# Patient Record
Sex: Female | Born: 1970
Health system: Southern US, Community
[De-identification: ages and names within clinical notes are randomized; demographics above are authoritative.]

## PROBLEM LIST (undated history)

## (undated) DIAGNOSIS — Z789 Other specified health status: Secondary | ICD-10-CM

## (undated) HISTORY — PX: TUBAL LIGATION: SHX77

## (undated) HISTORY — PX: APPENDECTOMY: SHX54

---

## 2017-01-19 ENCOUNTER — Ambulatory Visit (INDEPENDENT_AMBULATORY_CARE_PROVIDER_SITE_OTHER): Payer: 59 | Admitting: Physician Assistant

## 2017-01-19 ENCOUNTER — Encounter (INDEPENDENT_AMBULATORY_CARE_PROVIDER_SITE_OTHER): Payer: Self-pay | Admitting: Physician Assistant

## 2017-01-19 VITALS — BP 113/81 | HR 83 | Temp 98.6°F | Ht 63.5 in | Wt 264.2 lb

## 2017-01-19 DIAGNOSIS — D170 Benign lipomatous neoplasm of skin and subcutaneous tissue of head, face and neck: Secondary | ICD-10-CM | POA: Diagnosis not present

## 2017-01-19 DIAGNOSIS — K5900 Constipation, unspecified: Secondary | ICD-10-CM

## 2017-01-19 DIAGNOSIS — R202 Paresthesia of skin: Secondary | ICD-10-CM

## 2017-01-19 DIAGNOSIS — E669 Obesity, unspecified: Secondary | ICD-10-CM | POA: Diagnosis not present

## 2017-01-19 DIAGNOSIS — R030 Elevated blood-pressure reading, without diagnosis of hypertension: Secondary | ICD-10-CM | POA: Diagnosis not present

## 2017-01-19 MED ORDER — DOCUSATE SODIUM 50 MG PO CAPS: 50.0000 mg | ORAL_CAPSULE | Freq: Two times a day (BID) | ORAL | 0 refills | Status: DC

## 2017-01-19 MED ORDER — GABAPENTIN 300 MG PO CAPS: 300.0000 mg | ORAL_CAPSULE | Freq: Every day | ORAL | 0 refills | Status: DC

## 2017-01-19 NOTE — Progress Notes (Signed)
  Subjective:  Patient ID: Sherry Weaver, female    DOB: 09-25-1971  Age: 46 y.o. MRN: [id number]  CC: constipation, neck lump   HPI Sherry Weaver is a 46 y.o. female with a PMH of constipation presents to establish care. Has been having issues with constipation but has been eating Activia yogurt when constipated. Activia fully resolves the constipation for that specific occurrence but is concerned that there is always underlying constipation if no yogurt is eaten. Requests a stool softener. Also would like to address a large lump on the back of her neck. Was previously told it was a lipomabut never had it removed. She is amenable to having removal now. Lastly, she has occasional paresthesia of the RUE possibly attributed to the neck lump. Does not endorse any other symptoms.  Review of Systems  Constitutional: Negative for chills, fever and malaise/fatigue.  Eyes: Negative for blurred vision.  Respiratory: Negative for shortness of breath.   Cardiovascular: Negative for chest pain and palpitations.  Gastrointestinal: Positive for constipation. Negative for abdominal pain and nausea.  Genitourinary: Negative for dysuria and hematuria.  Musculoskeletal: Negative for joint pain and myalgias.  Skin: Negative for rash.       Lump on neck  Neurological: Positive for tingling. Negative for headaches.  Psychiatric/Behavioral: Negative for depression. The patient is not nervous/anxious.     Objective:  BP 113/81   Pulse 83   Temp 98.6 F (37 C) (Oral)   Ht 5' 3.5" (1.613 m)   Wt 264 lb 3.2 oz (119.8 kg)   LMP 01/07/2017 (Exact Date)   SpO2 97%   BMI 46.07 kg/m   BP/Weight 9/32/3557  Systolic BP 322  Diastolic BP 81  Wt. (Lbs) 264.2  BMI 46.07      Physical Exam  Constitutional: She is oriented to person, place, and time.  Well developed, obese, NAD, polite  HENT:  Head: Normocephalic and atraumatic.  Eyes: Conjunctivae are normal. No scleral icterus.  Neck: Normal  range of motion. Neck supple. No thyromegaly present.  Cardiovascular: Normal rate, regular rhythm and normal heart sounds.   Pulmonary/Chest: Effort normal and breath sounds normal.  Musculoskeletal: She exhibits no edema.  Neurological: She is alert and oriented to person, place, and time. No cranial nerve deficit. Coordination normal.  Skin: Skin is warm and dry. No rash noted. No erythema. No pallor.  Psychiatric: She has a normal mood and affect. Her behavior is normal. Thought content normal.  Vitals reviewed.    Assessment & Plan:   1. Constipation, unspecified constipation type - docusate sodium (COLACE) 50 MG capsule; Take 1 capsule (50 mg total) by mouth 2 (two) times daily.  Dispense: 10 capsule; Refill: 0  2. Paresthesia - gabapentin (NEURONTIN) 300 MG capsule; Take 1 capsule (300 mg total) by mouth at bedtime.  Dispense: 30 capsule; Refill: 0  3. Obesity, unspecified classification, unspecified obesity type, unspecified whether serious comorbidity present - Weight loss treatment will begin after her full physical and if her BP is normal.  4. Elevated blood pressure reading without diagnosis of hypertension     Follow-up: Return in about 1 week (around 01/26/2017) for full physical, BP.   Clent Demark PA

## 2017-01-19 NOTE — Patient Instructions (Signed)
Paresthesia Paresthesia is an abnormal burning or prickling sensation. This sensation is generally felt in the hands, arms, legs, or feet. However, it may occur in any part of the body. Usually, it is not painful. The feeling may be described as:  Tingling or numbness.  Pins and needles.  Skin crawling.  Buzzing.  Limbs falling asleep.  Itching. Most people experience temporary (transient) paresthesia at some time in their lives. Paresthesia may occur when you breathe too quickly (hyperventilation). It can also occur without any apparent cause. Commonly, paresthesia occurs when pressure is placed on a nerve. The sensation quickly goes away after the pressure is removed. For some people, however, paresthesia is a long-lasting (chronic) condition that is caused by an underlying disorder. If you continue to have paresthesia, you may need further medical evaluation. Follow these instructions at home: Watch your condition for any changes. Taking the following actions may help to lessen any discomfort that you are feeling:  Avoid drinking alcohol.  Try acupuncture or massage to help relieve your symptoms.  Keep all follow-up visits as directed by your health care provider. This is important. Contact a health care provider if:  You continue to have episodes of paresthesia.  Your burning or prickling feeling gets worse when you walk.  You have pain, cramps, or dizziness.  You develop a rash. Get help right away if:  You feel weak.  You have trouble walking or moving.  You have problems with speech, understanding, or vision.  You feel confused.  You cannot control your bladder or bowel movements.  You have numbness after an injury.  You faint. This information is not intended to replace advice given to you by your health care provider. Make sure you discuss any questions you have with your health care provider. Document Released: 09/09/2002 Document Revised: 02/25/2016 Document  Reviewed: 09/15/2014 Elsevier Interactive Patient Education  2017 [employer].

## 2017-01-23 ENCOUNTER — Telehealth (INDEPENDENT_AMBULATORY_CARE_PROVIDER_SITE_OTHER): Payer: Self-pay | Admitting: Physician Assistant

## 2017-01-23 NOTE — Telephone Encounter (Signed)
Fwd to PCP. Priscilla Kirstein S Floella Ensz, CMA  

## 2017-01-23 NOTE — Telephone Encounter (Signed)
Patient called stated needs RX for Stool softner and RX for pain in her arm  Please send to [facility] outpatient Pharm.  Please follow up with patient

## 2017-01-24 ENCOUNTER — Other Ambulatory Visit (INDEPENDENT_AMBULATORY_CARE_PROVIDER_SITE_OTHER): Payer: Self-pay | Admitting: Physician Assistant

## 2017-01-24 DIAGNOSIS — R202 Paresthesia of skin: Secondary | ICD-10-CM

## 2017-01-24 DIAGNOSIS — K5900 Constipation, unspecified: Secondary | ICD-10-CM

## 2017-01-24 MED ORDER — DOCUSATE SODIUM 50 MG PO CAPS: 50.0000 mg | ORAL_CAPSULE | Freq: Two times a day (BID) | ORAL | 0 refills | Status: DC

## 2017-01-24 MED ORDER — GABAPENTIN 300 MG PO CAPS: 300.0000 mg | ORAL_CAPSULE | Freq: Every day | ORAL | 0 refills | Status: DC

## 2017-01-24 NOTE — Progress Notes (Signed)
Resent rx

## 2017-01-24 NOTE — Telephone Encounter (Signed)
Notify patient that I resent gabapentin and stool softener.

## 2017-01-25 ENCOUNTER — Encounter (INDEPENDENT_AMBULATORY_CARE_PROVIDER_SITE_OTHER): Payer: Self-pay | Admitting: Physician Assistant

## 2017-01-25 ENCOUNTER — Ambulatory Visit (INDEPENDENT_AMBULATORY_CARE_PROVIDER_SITE_OTHER): Payer: 59 | Admitting: Physician Assistant

## 2017-01-25 ENCOUNTER — Other Ambulatory Visit (HOSPITAL_COMMUNITY)
Admission: RE | Admit: 2017-01-25 | Discharge: 2017-01-25 | Disposition: A | Discharge status: Home / Self Care | Payer: 59 | Source: Ambulatory Visit | Attending: Family Medicine | Admitting: Family Medicine

## 2017-01-25 VITALS — BP 114/82 | HR 76 | Temp 97.9°F | Wt 264.8 lb

## 2017-01-25 DIAGNOSIS — E669 Obesity, unspecified: Secondary | ICD-10-CM

## 2017-01-25 DIAGNOSIS — Z01419 Encounter for gynecological examination (general) (routine) without abnormal findings: Secondary | ICD-10-CM | POA: Insufficient documentation

## 2017-01-25 DIAGNOSIS — Z114 Encounter for screening for human immunodeficiency virus [HIV]: Secondary | ICD-10-CM | POA: Diagnosis not present

## 2017-01-25 DIAGNOSIS — Z124 Encounter for screening for malignant neoplasm of cervix: Secondary | ICD-10-CM | POA: Diagnosis not present

## 2017-01-25 DIAGNOSIS — Z23 Encounter for immunization: Secondary | ICD-10-CM | POA: Diagnosis not present

## 2017-01-25 DIAGNOSIS — Z1231 Encounter for screening mammogram for malignant neoplasm of breast: Secondary | ICD-10-CM | POA: Diagnosis not present

## 2017-01-25 DIAGNOSIS — Z0000 Encounter for general adult medical examination without abnormal findings: Secondary | ICD-10-CM

## 2017-01-25 MED ORDER — PHENTERMINE HCL 15 MG PO CAPS: 15.0000 mg | ORAL_CAPSULE | ORAL | 0 refills | Status: DC

## 2017-01-25 MED FILL — GABAPENTIN 300 MG CAPSULE: 300 | 30 days supply | Qty: 30 | Fill #0

## 2017-01-25 MED FILL — PHENTERMINE 15 MG CAPSULE: 15 | 30 days supply | Qty: 30 | Fill #0

## 2017-01-25 NOTE — Progress Notes (Signed)
Subjective:  Patient ID: Sherry Weaver, female    DOB: [birth year]  Age: 46 y.o. MRN: [id number]  CC: annual exam  HPI Sherry Weaver is a 46 y.o. female with a PMH of obesity presents for annual physical. No current complaints. Would like to start on weight loss medication. She has begun to exercise and watch her diet. Denies any current symptoms.   Outpatient Medications Prior to Visit  Medication Sig Dispense Refill  . docusate sodium (COLACE) 50 MG capsule Take 1 capsule (50 mg total) by mouth 2 (two) times daily. 10 capsule 0  . gabapentin (NEURONTIN) 300 MG capsule Take 1 capsule (300 mg total) by mouth at bedtime. 30 capsule 0   No facility-administered medications prior to visit.      ROS Review of Systems  Constitutional: Negative for chills, fever and malaise/fatigue.  Eyes: Negative for blurred vision.  Respiratory: Negative for shortness of breath.   Cardiovascular: Negative for chest pain, palpitations, orthopnea, leg swelling and PND.  Gastrointestinal: Negative for abdominal pain and nausea.  Genitourinary: Negative for dysuria and hematuria.  Musculoskeletal: Negative for joint pain and myalgias.  Skin: Negative for rash.  Neurological: Negative for tingling and headaches.  Psychiatric/Behavioral: Negative for depression. The patient is not nervous/anxious.     Objective:  BP 114/82 (BP Location: Left Arm, Patient Position: Sitting, Cuff Size: Large)   Pulse 76   Temp 97.9 F (36.6 C) (Oral)   Wt 264 lb 12.8 oz (120.1 kg)   LMP 01/07/2017 (Exact Date)   SpO2 98%   BMI 46.17 kg/m   BP/Weight 01/25/2017 01/24/5360  Systolic BP 443 154  Diastolic BP 82 81  Wt. (Lbs) 264.8 264.2  BMI 46.17 46.07      Physical Exam  Constitutional: She is oriented to person, place, and time.  Well developed, obese, NAD, polite  HENT:  Head: Normocephalic and atraumatic.  Eyes: EOM are normal. Pupils are equal, round, and reactive to light. No scleral icterus.   Neck: Normal range of motion. Neck supple. No thyromegaly present.  Cardiovascular: Normal rate, regular rhythm and normal heart sounds.   Pulmonary/Chest: Effort normal and breath sounds normal.  Abdominal: Soft. Bowel sounds are normal. She exhibits no distension. There is no tenderness.  Genitourinary: Vagina normal and uterus normal. No vaginal discharge found.  Musculoskeletal: She exhibits no edema, tenderness or deformity.  Back, LEs, and UEs with full aROM.   Lymphadenopathy:    She has no cervical adenopathy.  Neurological: She is alert and oriented to person, place, and time. No cranial nerve deficit. Coordination normal.  Strength 5/5 throughout. Patellar DTR 1+ bilaterally  Skin: Skin is warm and dry. No rash noted. No erythema. No pallor.  Psychiatric: She has a normal mood and affect. Her behavior is normal. Thought content normal.  Vitals reviewed.    Assessment & Plan:   1. Annual physical exam - CBC with Differential - Comprehensive metabolic panel - TSH - Lipid panel  2. Screening mammogram, encounter for - MM DIGITAL SCREENING BILATERAL; Future  3. Screening for cervical cancer - Cytology - PAP  4. Screening for HIV (human immunodeficiency virus) - HIV antibody  5. Need for Tdap vaccination - Tdap vaccine greater than or equal to 7yo IM  6. Obesity without serious comorbidity, unspecified classification, unspecified obesity type - phentermine 15 MG capsule; Take 1 capsule (15 mg total) by mouth every morning.  Dispense: 30 capsule; Refill: 0   Meds ordered this encounter  Medications  .  phentermine 15 MG capsule    Sig: Take 1 capsule (15 mg total) by mouth every morning.    Dispense:  30 capsule    Refill:  0    Order Specific Question:   Supervising Provider    Answer:   Tresa Garter [[id number]]    Follow-up: Return in about 4 weeks (around 02/22/2017) for Obesity.   Clent Demark PA

## 2017-01-25 NOTE — Patient Instructions (Addendum)
Phentermine tablets or capsules What is this medicine? PHENTERMINE (FEN ter meen) decreases your appetite. It is used with a reduced calorie diet and exercise to help you lose weight. This medicine may be used for other purposes; ask your health care provider or pharmacist if you have questions. COMMON BRAND NAME(S): Adipex-P, Atti-Plex P, Atti-Plex P Spansule, Fastin, Lomaira, Pro-Fast, Tara-8 What should I tell my health care provider before I take this medicine? They need to know if you have any of these conditions: -agitation -glaucoma -heart disease -high blood pressure -history of substance abuse -lung disease called Primary Pulmonary Hypertension (PPH) -taken an MAOI like Carbex, Eldepryl, Marplan, Nardil, or Parnate in last 14 days -thyroid disease -an unusual or allergic reaction to phentermine, other medicines, foods, dyes, or preservatives -pregnant or trying to get pregnant -breast-feeding How should I use this medicine? Take this medicine by mouth with a glass of water. Follow the directions on the prescription label. The instructions for use may differ based on the product and dose you are taking. Avoid taking this medicine in the evening. It may interfere with sleep. Take your doses at regular intervals. Do not take your medicine more often than directed. Talk to your pediatrician regarding the use of this medicine in children. While this drug may be prescribed for children 17 years or older for selected conditions, precautions do apply. Overdosage: If you think you have taken too much of this medicine contact a poison control center or emergency room at once. NOTE: This medicine is only for you. Do not share this medicine with others. What if I miss a dose? If you miss a dose, take it as soon as you can. If it is almost time for your next dose, take only that dose. Do not take double or extra doses. What may interact with this medicine? Do not take this medicine with any of  the following medications: -duloxetine -MAOIs like Carbex, Eldepryl, Marplan, Nardil, and Parnate -medicines for colds or breathing difficulties like pseudoephedrine or phenylephrine -procarbazine -sibutramine -SSRIs like citalopram, escitalopram, fluoxetine, fluvoxamine, paroxetine, and sertraline -stimulants like dexmethylphenidate, methylphenidate or modafinil -venlafaxine This medicine may also interact with the following medications: -medicines for diabetes This list may not describe all possible interactions. Give your health care provider a list of all the medicines, herbs, non-prescription drugs, or dietary supplements you use. Also tell them if you smoke, drink alcohol, or use illegal drugs. Some items may interact with your medicine. What should I watch for while using this medicine? Notify your physician immediately if you become short of breath while doing your normal activities. Do not take this medicine within 6 hours of bedtime. It can keep you from getting to sleep. Avoid drinks that contain caffeine and try to stick to a regular bedtime every night. This medicine was intended to be used in addition to a healthy diet and exercise. The best results are achieved this way. This medicine is only indicated for short-term use. Eventually your weight loss may level out. At that point, the drug will only help you maintain your new weight. Do not increase or in any way change your dose without consulting your doctor. You may get drowsy or dizzy. Do not drive, use machinery, or do anything that needs mental alertness until you know how this medicine affects you. Do not stand or sit up quickly, especially if you are an older patient. This reduces the risk of dizzy or fainting spells. Alcohol may increase dizziness and drowsiness. Avoid alcoholic   drinks. What side effects may I notice from receiving this medicine? Side effects that you should report to your doctor or health care professional as  soon as possible: -chest pain, palpitations -depression or severe changes in mood -increased blood pressure -irritability -nervousness or restlessness -severe dizziness -shortness of breath -problems urinating -unusual swelling of the legs -vomiting Side effects that usually do not require medical attention (report to your doctor or health care professional if they continue or are bothersome): -blurred vision or other eye problems -changes in sexual ability or desire -constipation or diarrhea -difficulty sleeping -dry mouth or unpleasant taste -headache -nausea This list may not describe all possible side effects. Call your doctor for medical advice about side effects. You may report side effects to FDA at 1-800-FDA-1088. Where should I keep my medicine? Keep out of the reach of children. This medicine can be abused. Keep your medicine in a safe place to protect it from theft. Do not share this medicine with anyone. Selling or giving away this medicine is dangerous and against the law. This medicine may cause accidental overdose and death if taken by other adults, children, or pets. Mix any unused medicine with a substance like cat litter or coffee grounds. Then throw the medicine away in a sealed container like a sealed bag or a coffee can with a lid. Do not use the medicine after the expiration date. Store at room temperature between 20 and 25 degrees C (68 and 77 degrees F). Keep container tightly closed. NOTE: This sheet is a summary. It may not cover all possible information. If you have questions about this medicine, talk to your doctor, pharmacist, or health care provider.  2018 Elsevier/Gold Standard (2015-06-26 12:53:15) Obesity, Adult Obesity is the condition of having too much total body fat. Being overweight or obese means that your weight is greater than what is considered healthy for your body size. Obesity is determined by a measurement called BMI. BMI is an estimate of  body fat and is calculated from height and weight. For adults, a BMI of 30 or higher is considered obese. Obesity can eventually lead to other health concerns and major illnesses, including:  Stroke.  Coronary artery disease (CAD).  Type 2 diabetes.  Some types of cancer, including cancers of the colon, breast, uterus, and gallbladder.  Osteoarthritis.  High blood pressure (hypertension).  High cholesterol.  Sleep apnea.  Gallbladder stones.  Infertility problems. What are the causes? The main cause of obesity is taking in (consuming) more calories than your body uses for energy. Other factors that contribute to this condition may include:  Being born with genes that make you more likely to become obese.  Having a medical condition that causes obesity. These conditions include:  Hypothyroidism.  Polycystic ovarian syndrome (PCOS).  Binge-eating disorder.  Cushing syndrome.  Taking certain medicines, such as steroids, antidepressants, and seizure medicines.  Not being physically active (sedentary lifestyle).  Living where there are limited places to exercise safely or buy healthy foods.  Not getting enough sleep. What increases the risk? The following factors may increase your risk of this condition:  Having a family history of obesity.  Being a woman of African-American descent.  Being a man of Hispanic descent. What are the signs or symptoms? Having excessive body fat is the main symptom of this condition. How is this diagnosed? This condition may be diagnosed based on:  Your symptoms.  Your medical history.  A physical exam. Your health care provider may measure:  Your BMI. If you are an adult with a BMI between 25 and less than 30, you are considered overweight. If you are an adult with a BMI of 30 or higher, you are considered obese.  The distances around your hips and your waist (circumferences). These may be compared to each other to help  diagnose your condition.  Your skinfold thickness. Your health care provider may gently pinch a fold of your skin and measure it. How is this treated? Treatment for this condition often includes changing your lifestyle. Treatment may include some or all of the following:  Dietary changes. Work with your health care provider and a dietitian to set a weight-loss goal that is healthy and reasonable for you. Dietary changes may include eating:  Smaller portions. A portion size is the amount of a particular food that is healthy for you to eat at one time. This varies from person to person.  Low-calorie or low-fat options.  More whole grains, fruits, and vegetables.  Regular physical activity. This may include aerobic activity (cardio) and strength training.  Medicine to help you lose weight. Your health care provider may prescribe medicine if you are unable to lose 1 pound a week after 6 weeks of eating more healthily and doing more physical activity.  Surgery. Surgical options may include gastric banding and gastric bypass. Surgery may be done if:  Other treatments have not helped to improve your condition.  You have a BMI of 40 or higher.  You have life-threatening health problems related to obesity. Follow these instructions at home:   Eating and drinking    Follow recommendations from your health care provider about what you eat and drink. Your health care provider may advise you to:  Limit fast foods, sweets, and processed snack foods.  Choose low-fat options, such as low-fat milk instead of whole milk.  Eat 5 or more servings of fruits or vegetables every day.  Eat at home more often. This gives you more control over what you eat.  Choose healthy foods when you eat out.  Learn what a healthy portion size is.  Keep low-fat snacks on hand.  Avoid sugary drinks, such as soda, fruit juice, iced tea sweetened with sugar, and flavored milk.  Eat a healthy  breakfast.  Drink enough water to keep your urine clear or pale yellow.  Do not go without eating for long periods of time (do not fast) or follow a fad diet. Fasting and fad diets can be unhealthy and even dangerous. Physical Activity   Exercise regularly, as told by your health care provider. Ask your health care provider what types of exercise are safe for you and how often you should exercise.  Warm up and stretch before being active.  Cool down and stretch after being active.  Rest between periods of activity. Lifestyle   Limit the time that you spend in front of your TV, computer, or video game system.  Find ways to reward yourself that do not involve food.  Limit alcohol intake to no more than 1 drink a day for nonpregnant women and 2 drinks a day for men. One drink equals 12 oz of beer, 5 oz of wine, or 1 oz of hard liquor. General instructions   Keep a weight loss journal to keep track of the food you eat and how much you exercise you get.  Take over-the-counter and prescription medicines only as told by your health care provider.  Take vitamins and supplements only as told  by your health care provider.  Consider joining a support group. Your health care provider may be able to recommend a support group.  Keep all follow-up visits as told by your health care provider. This is important. Contact a health care provider if:  You are unable to meet your weight loss goal after 6 weeks of dietary and lifestyle changes. This information is not intended to replace advice given to you by your health care provider. Make sure you discuss any questions you have with your health care provider. Document Released: 10/27/2004 Document Revised: 02/22/2016 Document Reviewed: 07/08/2015 Elsevier Interactive Patient Education  2017 [employer].

## 2017-01-25 NOTE — Telephone Encounter (Signed)
Patient notified. Nat Christen, CMA

## 2017-01-26 ENCOUNTER — Other Ambulatory Visit (INDEPENDENT_AMBULATORY_CARE_PROVIDER_SITE_OTHER): Payer: Self-pay | Admitting: Physician Assistant

## 2017-01-26 DIAGNOSIS — Z1231 Encounter for screening mammogram for malignant neoplasm of breast: Secondary | ICD-10-CM

## 2017-01-26 LAB — LIPID PANEL
Chol/HDL Ratio: 5 ratio — ABNORMAL HIGH (ref 0.0–4.4)
Cholesterol, Total: 201 mg/dL — ABNORMAL HIGH (ref 100–199)
HDL: 40 mg/dL (ref >39)
LDL Calculated: 140 mg/dL — ABNORMAL HIGH (ref 0–99)
Triglycerides: 105 mg/dL (ref 0–149)
VLDL Cholesterol Cal: 21 mg/dL (ref 5–40)

## 2017-01-26 LAB — CBC WITH DIFFERENTIAL/PLATELET
BASOS ABS: 0 10*3/uL (ref 0.0–0.2)
Basos: 0 %
EOS (ABSOLUTE): 0.1 10*3/uL (ref 0.0–0.4)
Eos: 1 %
Hematocrit: 35.4 % (ref 34.0–46.6)
Hemoglobin: 11.4 g/dL (ref 11.1–15.9)
Immature Grans (Abs): 0 10*3/uL (ref 0.0–0.1)
Immature Granulocytes: 0 %
LYMPHS ABS: 5 10*3/uL — AB (ref 0.7–3.1)
Lymphs: 59 %
MCH: 26.8 pg (ref 26.6–33.0)
MCHC: 32.2 g/dL (ref 31.5–35.7)
MCV: 83 fL (ref 79–97)
MONOS ABS: 0.5 10*3/uL (ref 0.1–0.9)
Monocytes: 6 %
NEUTROS ABS: 2.9 10*3/uL (ref 1.4–7.0)
Neutrophils: 34 %
PLATELETS: 314 10*3/uL (ref 150–379)
RBC: 4.25 x10E6/uL (ref 3.77–5.28)
RDW: 15.2 % (ref 12.3–15.4)
WBC: 8.6 10*3/uL (ref 3.4–10.8)

## 2017-01-26 LAB — COMPREHENSIVE METABOLIC PANEL
ALBUMIN: 3.8 g/dL (ref 3.5–5.5)
ALK PHOS: 51 IU/L (ref 39–117)
ALT: 15 IU/L (ref 0–32)
AST: 15 IU/L (ref 0–40)
Albumin/Globulin Ratio: 1 — ABNORMAL LOW (ref 1.2–2.2)
BUN / CREAT RATIO: 15 (ref 9–23)
BUN: 11 mg/dL (ref 6–24)
CHLORIDE: 104 mmol/L (ref 96–106)
CO2: 20 mmol/L (ref 18–29)
Calcium: 8.8 mg/dL (ref 8.7–10.2)
Creatinine, Ser: 0.75 mg/dL (ref 0.57–1.00)
GFR calc Af Amer: 111 mL/min/{1.73_m2} (ref >59)
GFR calc non Af Amer: 97 mL/min/{1.73_m2} (ref >59)
GLUCOSE: 94 mg/dL (ref 65–99)
Globulin, Total: 3.8 g/dL (ref 1.5–4.5)
Potassium: 3.9 mmol/L (ref 3.5–5.2)
Sodium: 139 mmol/L (ref 134–144)
Total Protein: 7.6 g/dL (ref 6.0–8.5)

## 2017-01-26 LAB — TSH: TSH: 3.52 u[IU]/mL (ref 0.450–4.500)

## 2017-01-26 LAB — CYTOLOGY - PAP: Diagnosis: NEGATIVE

## 2017-01-26 LAB — HIV ANTIBODY (ROUTINE TESTING W REFLEX): HIV SCREEN 4TH GENERATION: NONREACTIVE

## 2017-02-20 ENCOUNTER — Ambulatory Visit: Payer: 59

## 2017-02-25 ENCOUNTER — Emergency Department (HOSPITAL_COMMUNITY)
Admission: EM | Admit: 2017-02-25 | Discharge: 2017-02-25 | Disposition: A | Discharge status: Home / Self Care | Payer: 59 | Attending: Emergency Medicine | Admitting: Emergency Medicine

## 2017-02-25 ENCOUNTER — Encounter (HOSPITAL_COMMUNITY): Payer: Self-pay

## 2017-02-25 DIAGNOSIS — T781XXA Other adverse food reactions, not elsewhere classified, initial encounter: Secondary | ICD-10-CM | POA: Diagnosis not present

## 2017-02-25 DIAGNOSIS — T7840XA Allergy, unspecified, initial encounter: Secondary | ICD-10-CM

## 2017-02-25 DIAGNOSIS — Z91013 Allergy to seafood: Secondary | ICD-10-CM | POA: Diagnosis not present

## 2017-02-25 MED ORDER — METHYLPREDNISOLONE SODIUM SUCC 125 MG IJ SOLR
125.0000 mg | Freq: Once | INTRAMUSCULAR | Status: AC
  Administered 2017-02-25: 125 mg via INTRAVENOUS
  Filled 2017-02-25: qty 2

## 2017-02-25 MED ORDER — PREDNISONE 20 MG PO TABS: 40.0000 mg | ORAL_TABLET | Freq: Every day | ORAL | 0 refills | Status: DC

## 2017-02-25 MED ORDER — EPINEPHRINE 0.3 MG/0.3ML IJ SOAJ
0.3000 mg | Freq: Once | INTRAMUSCULAR | Status: AC
  Administered 2017-02-25: 0.3 mg via INTRAMUSCULAR
  Filled 2017-02-25: qty 0.3

## 2017-02-25 MED ORDER — DIPHENHYDRAMINE HCL 50 MG/ML IJ SOLN
50.0000 mg | Freq: Once | INTRAMUSCULAR | Status: AC
  Administered 2017-02-25: 50 mg via INTRAVENOUS
  Filled 2017-02-25: qty 1

## 2017-02-25 MED ORDER — EPINEPHRINE 0.3 MG/0.3ML IJ SOAJ: 0.3000 mg | Freq: Once | INTRAMUSCULAR | 1 refills | Status: AC | PRN

## 2017-02-25 MED ORDER — FAMOTIDINE IN NACL 20-0.9 MG/50ML-% IV SOLN
20.0000 mg | Freq: Once | INTRAVENOUS | Status: AC
  Administered 2017-02-25: 20 mg via INTRAVENOUS
  Filled 2017-02-25: qty 50

## 2017-02-25 NOTE — Discharge Instructions (Signed)
You need to follow-up with an allergist.  See the information provided.  If you experience these symptoms again give yourself the epipen and call 911.

## 2017-02-25 NOTE — ED Triage Notes (Signed)
Pt complaining of allergic reaction. Pt states ate seafood tonight. Began itching in bilateral hand and throat x 1 hour. Pt with clear breathsounds, airway intact. Sats = 98% on RA.

## 2017-02-25 NOTE — ED Provider Notes (Signed)
[facility] DEPT Provider Note   CSN: [id number] Arrival date & time: 02/25/17  0057     History   Chief Complaint Chief Complaint  Patient presents with  . Allergic Reaction    HPI Sherry Weaver is a 46 y.o. female.  Patient presents emergency department with chief complaint of allergic reaction. She states that she ate shrimp tonight. She has never had an allergic reaction to shrimp before, but states that shortly thereafter she began itching, and began having swelling of bilateral hands and feet and also began having some tightness sensation in her throat with tingling around her lips. She states that she feels slightly short of breath. She denies any wheezing. She has not taken anything for her symptoms. There are no other associated symptoms or modifying factors. She has never had an anaphylactic reaction before.   The history is provided by the patient. No language interpreter was used.    History reviewed. No pertinent past medical history.  Patient Active Problem List   Diagnosis Date Noted  . 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 Obesity 01/19/2017  . Constipation 01/19/2017    History reviewed. No pertinent surgical history.  OB History    No data available       Home Medications    Prior to Admission medications   Medication Sig Start Date End Date Taking? Authorizing Provider  docusate sodium (COLACE) 50 MG capsule Take 1 capsule (50 mg total) by mouth 2 (two) times daily. 01/24/17   Clent Demark, PA-C  gabapentin (NEURONTIN) 300 MG capsule Take 1 capsule (300 mg total) by mouth at bedtime. 01/24/17   Clent Demark, PA-C  phentermine 15 MG capsule Take 1 capsule (15 mg total) by mouth every morning. 01/25/17   Clent Demark, PA-C    Family History History reviewed. No pertinent family history.  Social History Social History  Substance Use Topics  . Smoking status: Never Smoker  . Smokeless tobacco: Never Used  . Alcohol use No      Allergies   Penicillins   Review of Systems Review of Systems  All other systems reviewed and are negative.    Physical Exam Updated Vital Signs BP (!) 146/87 (BP Location: Left Arm)   Pulse 88   Temp 99 F (37.2 C) (Oral)   Resp 20   LMP 02/24/2017 (Exact Date)   SpO2 97%   Physical Exam  Constitutional: She is oriented to person, place, and time. She appears well-developed and well-nourished.  HENT:  Head: Normocephalic and atraumatic.  Mildly raspy voice, airway is intact, no evidence of oropharyngeal swelling on exam  Eyes: Conjunctivae and EOM are normal. Pupils are equal, round, and reactive to light.  Neck: Normal range of motion. Neck supple.  Cardiovascular: Normal rate and regular rhythm.  Exam reveals no gallop and no friction rub.   No murmur heard. Pulmonary/Chest: Effort normal and breath sounds normal. No respiratory distress. She has no wheezes. She has no rales. She exhibits no tenderness.  Clear to auscultation bilaterally  Abdominal: Soft. Bowel sounds are normal. She exhibits no distension and no mass. There is no tenderness. There is no rebound and no guarding.  Musculoskeletal: Normal range of motion. She exhibits no edema or tenderness.  Neurological: She is alert and oriented to person, place, and time.  Skin: Skin is warm and dry.  No active hives  Psychiatric: She has a normal mood and affect. Her behavior is normal. Judgment and thought content normal.  Nursing  note and vitals reviewed.    ED Treatments / Results  Labs (all labs ordered are listed, but only abnormal results are displayed) Labs Reviewed - No data to display  EKG  EKG Interpretation None       Radiology No results found.  Procedures Procedures (including critical care time)  Medications Ordered in ED Medications  famotidine (PEPCID) IVPB 20 mg premix (20 mg Intravenous New Bag/Given 02/25/17 0141)  EPINEPHrine (EPI-PEN) injection 0.3 mg (0.3 mg  Intramuscular Given 02/25/17 0143)  diphenhydrAMINE (BENADRYL) injection 50 mg (50 mg Intravenous Given 02/25/17 0139)  methylPREDNISolone sodium succinate (SOLU-MEDROL) 125 mg/2 mL injection 125 mg (125 mg Intravenous Given 02/25/17 0139)     Initial Impression / Assessment and Plan / ED Course  I have reviewed the triage vital signs and the nursing notes.  Pertinent labs & imaging results that were available during my care of the patient were reviewed by me and considered in my medical decision making (see chart for details).     Patient with throat tightening sensation, lip tingling sensation and some shortness of breath shortly after eating shrimp. She is not in extremis, however given the patient's symptoms, will treat with EpiPen and will give Solu-Medrol, Benadryl, and Pepcid. Plan to monitor patient for at least 4 hours.  2:01 AM Patient reassessed, she states that the tingling sensation and throat tightness sensation have stopped. She is in no apparent distress.  5:27 AM Patient reassessed. She no longer has any raspiness to her voice. She has no rash. Her airway is clear. She has no wheezing on exam. She states that she feels normal now. Will discharge to home with EpiPen prescription and prednisone. Recommend that she follow-up with an allergist. Patient understands and agrees with the plan. She is stable and ready for discharge.  Final Clinical Impressions(s) / ED Diagnoses   Final diagnoses:  Allergic reaction, initial encounter    New Prescriptions New Prescriptions   EPINEPHRINE (EPIPEN 2-PAK) 0.3 MG/0.3 ML IJ SOAJ INJECTION    Inject 0.3 mLs (0.3 mg total) into the muscle once as needed (for severe allergic reaction). CAll 911 immediately if you have to use this medicine   PREDNISONE (DELTASONE) 20 MG TABLET    Take 2 tablets (40 mg total) by mouth daily.     Montine Circle, PA-C 02/25/17 Brewster, April, MD 02/25/17 [phone]

## 2017-02-28 MED FILL — predniSONE 20 MG TABS: 20 | 5 days supply | Qty: 10 | Fill #0

## 2017-02-28 MED FILL — EPINEPHRINE 0.3 MG AUTO-INJ: 0.3 | 2 days supply | Qty: 2 | Fill #0

## 2017-04-20 ENCOUNTER — Ambulatory Visit (INDEPENDENT_AMBULATORY_CARE_PROVIDER_SITE_OTHER): Payer: 59 | Admitting: Physician Assistant

## 2017-05-03 ENCOUNTER — Encounter (INDEPENDENT_AMBULATORY_CARE_PROVIDER_SITE_OTHER): Payer: Self-pay | Admitting: Physician Assistant

## 2017-05-03 ENCOUNTER — Ambulatory Visit (INDEPENDENT_AMBULATORY_CARE_PROVIDER_SITE_OTHER): Payer: 59 | Admitting: Physician Assistant

## 2017-05-03 VITALS — BP 114/79 | HR 80 | Temp 98.1°F | Wt 263.2 lb

## 2017-05-03 DIAGNOSIS — D179 Benign lipomatous neoplasm, unspecified: Secondary | ICD-10-CM | POA: Diagnosis not present

## 2017-05-03 DIAGNOSIS — Z23 Encounter for immunization: Secondary | ICD-10-CM | POA: Diagnosis not present

## 2017-05-03 DIAGNOSIS — R202 Paresthesia of skin: Secondary | ICD-10-CM

## 2017-05-03 DIAGNOSIS — E669 Obesity, unspecified: Secondary | ICD-10-CM

## 2017-05-03 NOTE — Patient Instructions (Signed)

## 2017-05-03 NOTE — Progress Notes (Signed)
Subjective:  Patient ID: Sherry Weaver, female    DOB: 11/02/1970  Age: 46 y.o. MRN: [id number]  CC: f/u obesity  HPI Sherry Weaver is a 46 y.o. female with obesity presents to f/u on obesity. Was taking phentermine 15 mg but eventually had to stop due to tachycardia and hot flashes. She was exercising at one point. Has lost one pound over approximately 3 months.    Patient also complains of paresthesia of the LUE and RUE. Tried gabapentin with no relief of paresthesia. Has surgical consult tomorrow to have lipoma removed. It is thought the lipoma on the back of the neck is having a mass effect to the c spine nerve roots.    Outpatient Medications Prior to Visit  Medication Sig Dispense Refill  . docusate sodium (COLACE) 50 MG capsule Take 1 capsule (50 mg total) by mouth 2 (two) times daily. 10 capsule 0  . EPINEPHrine (EPIPEN 2-PAK) 0.3 mg/0.3 mL IJ SOAJ injection Inject 0.3 mLs (0.3 mg total) into the muscle once as needed (for severe allergic reaction). CAll 911 immediately if you have to use this medicine 1 Device 1  . gabapentin (NEURONTIN) 300 MG capsule Take 1 capsule (300 mg total) by mouth at bedtime. 30 capsule 0  . phentermine 15 MG capsule Take 1 capsule (15 mg total) by mouth every morning. 30 capsule 0  . predniSONE (DELTASONE) 20 MG tablet Take 2 tablets (40 mg total) by mouth daily. 10 tablet 0   No facility-administered medications prior to visit.      ROS Review of Systems  Constitutional: Negative for chills, fever and malaise/fatigue.  Eyes: Negative for blurred vision.  Respiratory: Negative for shortness of breath.   Cardiovascular: Negative for chest pain and palpitations.  Gastrointestinal: Negative for abdominal pain and nausea.  Genitourinary: Negative for dysuria and hematuria.  Musculoskeletal: Negative for joint pain and myalgias.  Skin: Negative for rash.  Neurological: Positive for tingling. Negative for headaches.  Psychiatric/Behavioral:  Negative for depression. The patient is not nervous/anxious.     Objective:  BP 114/79 (BP Location: Left Arm, Patient Position: Sitting, Cuff Size: Large)   Pulse 80   Temp 98.1 F (36.7 C) (Oral)   Wt 263 lb 3.2 oz (119.4 kg)   SpO2 96%   BMI 45.89 kg/m   BP/Weight 05/03/2017 02/25/2017 9/98/3382  Systolic BP 505 397 673  Diastolic BP 79 79 82  Wt. (Lbs) 263.2 - 264.8  BMI 45.89 - 46.17      Physical Exam  Constitutional: She is oriented to person, place, and time.  Well developed, well nourished, NAD, polite  HENT:  Head: Normocephalic and atraumatic.  Eyes: No scleral icterus.  Neck: Normal range of motion. Neck supple. No thyromegaly present.  Cardiovascular: Normal rate, regular rhythm and normal heart sounds.   Pulmonary/Chest: Effort normal and breath sounds normal.  Abdominal: Soft. Bowel sounds are normal. There is no tenderness.  Musculoskeletal: She exhibits no edema.  Neurological: She is alert and oriented to person, place, and time. No cranial nerve deficit.  Light touch sensation of the UE intact bilaterally. Strength 5/5 of UE bilaterally.  Skin: Skin is warm and dry. No rash noted. No erythema. No pallor.  Psychiatric: She has a normal mood and affect. Her behavior is normal. Thought content normal.  Vitals reviewed.    Assessment & Plan:   1. Obesity, unspecified classification, unspecified obesity type, unspecified whether serious comorbidity present - Stop phentermine.  - Will advise on different pharmacological  options after her lipoma excision.  2. Lipoma, unspecified site - Will have surgical consult tomorrow for excision of large nuchal lipoma.  3. Paresthesia - upper extremities likely affected by the lipoma's mass effect on back of neck.  4. Need for Tdap vaccination - Tdap vaccine greater than or equal to 7yo IM     Follow-up: Return in about 4 weeks (around 05/31/2017) for paresthesia.   Clent Demark PA

## 2017-05-04 ENCOUNTER — Other Ambulatory Visit: Payer: Self-pay | Admitting: General Surgery

## 2017-05-04 DIAGNOSIS — Z9851 Tubal ligation status: Secondary | ICD-10-CM | POA: Diagnosis not present

## 2017-05-04 DIAGNOSIS — Z6841 Body Mass Index (BMI) 40.0 and over, adult: Secondary | ICD-10-CM | POA: Diagnosis not present

## 2017-05-04 DIAGNOSIS — Z9049 Acquired absence of other specified parts of digestive tract: Secondary | ICD-10-CM | POA: Diagnosis not present

## 2017-05-04 DIAGNOSIS — R221 Localized swelling, mass and lump, neck: Secondary | ICD-10-CM | POA: Diagnosis not present

## 2017-05-04 DIAGNOSIS — R202 Paresthesia of skin: Secondary | ICD-10-CM | POA: Diagnosis not present

## 2017-05-05 NOTE — H&P (Signed)
Sherry Weaver Location: [facility] Patient #: [id number] DOB: Jan 12, 1971 Married / Language: Undefined / Race: Black or African American Female        History of Present Illness     . 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 She is referred by Clent Demark, PA at [facility], for evaluation management of a huge lipoma of the posterior neck.      She states that she has had a palpable mass on her lower posterior neck for over 4 years. This has been enlarging. She also reports numbness and tingling in both arms from shoulder to hand, intermittent. No pain. The mass on the neck has never been tender or inflamed.       Comorbidities include BMI 47, paresthesias of both arms, borderline hypertension, constipation, history of open appendectomy, history laparoscopic tubal ligation      Social history reveals that she is married with 6 children. They're mostly grown. 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 Denies tobacco or alcohol.       She states she would like the mass removed from her neck regardless because its enlarging and very prominent and visible. That is reasonable. I told her that the numbness and tingling s very unlikely to be caused by the lipoma, and I suggested that it would need to be evaluated by a neurologist or a neurosurgeon. She understands that well I discussed the indications, details, techniques, and numerous risk of the surgery with her. She is aware of the risk of bleeding, infection, drain placement, skin necrosis, seroma formation, nerve damage with chronic pain or numbness. She understands these issues well. All her questions were answered. She agrees with this plan.      She will need to have this surgery done within a [facility] facility due to her employment and insurance issues.   Past Surgical History Appendectomy   Diagnostic Studies History  Colonoscopy   never Mammogram  never Pap Smear  1-5 years ago  Allergies  Penicillin V *PENICILLINS*  SHRIMP  Allergies Reconciled   Medication History  No Current Medications Medications Reconciled  Social History Caffeine use  Coffee, Tea. No alcohol use  No drug use  Tobacco use  Never smoker.  Family History Diabetes Mellitus  Family Members In General. Heart Disease  Family Members In General.  Pregnancy / Birth History  Age at menarche  9 years. Gravida  7 Length (months) of breastfeeding  3-6 Maternal age  20-20 Para  7 Regular periods   Other Problems  No pertinent past medical history     Review of Systems General Not Present- Appetite Loss, Chills, Fatigue, Fever, Night Sweats, Weight Gain and Weight Loss. Skin Not Present- Change in Wart/Mole, Dryness, Hives, Jaundice, New Lesions, Non-Healing Wounds, Rash and Ulcer. HEENT Not Present- Earache, Hearing Loss, Hoarseness, Nose Bleed, Oral Ulcers, Ringing in the Ears, Seasonal Allergies, Sinus Pain, Sore Throat, Visual Disturbances, Wears glasses/contact lenses and Yellow Eyes. Respiratory Not Present- Bloody sputum, Chronic Cough, Difficulty Breathing, Snoring and Wheezing. Breast Not Present- Breast Mass, Breast Pain, Nipple Discharge and Skin Changes. Cardiovascular Not Present- Chest Pain, Difficulty Breathing Lying Down, Leg Cramps, Palpitations, Rapid Heart Rate, Shortness of Breath and Swelling of Extremities. Gastrointestinal Not Present- Abdominal Pain, Bloating, Bloody Stool, Change in Bowel Habits, Chronic diarrhea, Constipation, Difficulty Swallowing, Excessive gas, Gets full quickly at meals, Hemorrhoids, Indigestion, Nausea, Rectal Pain and Vomiting. Female  Genitourinary Not Present- Frequency, Nocturia, Painful Urination, Pelvic Pain and Urgency. Musculoskeletal Not Present- Back Pain, Joint Pain, Joint Stiffness, Muscle Pain, Muscle Weakness and Swelling of Extremities. Neurological  Present- Numbness. Not Present- Decreased Memory, Fainting, Headaches, Seizures, Tingling, Tremor, Trouble walking and Weakness. Psychiatric Not Present- Anxiety, Bipolar, Change in Sleep Pattern, Depression, Fearful and Frequent crying. Endocrine Not Present- Cold Intolerance, Excessive Hunger, Hair Changes, Heat Intolerance, Hot flashes and New Diabetes. Hematology Not Present- Blood Thinners, Easy Bruising, Excessive bleeding, Gland problems, HIV and Persistent Infections.  Vitals  Weight: 266.8 lb Height: 63in Body Surface Area: 2.19 m Body Mass Index: 47.26 kg/m  Temp.: 98.66F  Pulse: 111 (Regular)  P.OX: 99% (Room air) BP: 138/84 (Sitting, Left Arm, Standard)    History and Physical General Mental Status-Alert. General Appearance-Consistent with stated age. Hydration-Well hydrated. Voice-Normal. Note: BMI 47.   Head and Neck Head-normocephalic, atraumatic with no lesions or palpable masses. Trachea-midline. Thyroid Gland Characteristics - normal size and consistency. Note: 12-15 cm soft tissue mass in the midline of the lower posterior neck. This is overlying the third cervical vertebra down to about the second thoracic vertebra. Healthy skin. Very soft and feels like a lipoma. Nontender. No adenopathy   Eye Eyeball - Bilateral-Extraocular movements intact. Sclera/Conjunctiva - Bilateral-No scleral icterus.  Chest and Lung Exam Chest and lung exam reveals -quiet, even and easy respiratory effort with no use of accessory muscles and on auscultation, normal breath sounds, no adventitious sounds and normal vocal resonance. Inspection Chest Wall - Normal. Back - normal.  Cardiovascular Cardiovascular examination reveals -normal heart sounds, regular rate and rhythm with no murmurs and normal pedal pulses bilaterally.  Abdomen Inspection Inspection of the abdomen reveals - No Hernias. Skin - Scar - Note: Right lower quadrant scar.  Umbilical scar. Healed. Palpation/Percussion Palpation and Percussion of the abdomen reveal - Soft, Non Tender, No Rebound tenderness, No Rigidity (guarding) and No hepatosplenomegaly. Auscultation Auscultation of the abdomen reveals - Bowel sounds normal.  Neurologic Neurologic evaluation reveals -alert and oriented x 3 with no impairment of recent or remote memory. Mental Status-Normal.  Musculoskeletal Normal Exam - Left-Upper Extremity Strength Normal and Lower Extremity Strength Normal. Normal Exam - Right-Upper Extremity Strength Normal and Lower Extremity Strength Normal.  Lymphatic Head & Neck  General Head & Neck Lymphatics: Bilateral - Description - Normal. Axillary  General Axillary Region: Bilateral - Description - Normal. Tenderness - Non Tender. Femoral & Inguinal  Generalized Femoral & Inguinal Lymphatics: Bilateral - Description - Normal. Tenderness - Non Tender.    Assessment & Plan  NECK MASS (R22.1) Impression: Posterior neck. 10-12 cm. Suspect lipoma.   The soft tissue mass of your lower posterior neck is almost certainly a lipoma It is almost 15 cm in diameter  This mass is not causing the numbness and tingling in your arms. I suggest she see a neurosurgeon or a neurologist to find a better explanation for this. This may be related to cervical spine disease  Because of the enlargement of the posterior neck mass, you have requested that this be excised you'll be scheduled for excision of the soft tissue mass of your posterior neck under general anesthesia in the near future You should be able to go home the same day You may or may not have a drain postop We have discussed the indications, techniques, and risks of the surgery in detail  BMI 45.0-49.9, ADULT (Z68.42) PARESTHESIA OF ARM (R20.2) Impression: Both arms from shoulder down to and HISTORY OF APPENDECTOMY (  Z90.49) Impression: Open appendectomy. RLQ incision HISTORY OF BILATERAL  TUBAL LIGATION (Z98.51)    Edsel Petrin. Dalbert Batman, M.D., [facility] General and Minimally invasive Surgery Breast and Colorectal Surgery Office:   [phone] Pager:   [phone]

## 2017-05-08 ENCOUNTER — Encounter (HOSPITAL_COMMUNITY): Payer: Self-pay | Admitting: *Deleted

## 2017-05-08 NOTE — Progress Notes (Signed)
Spoke with pt for pre-op call. Pt denies cardiac history, chest pain or sob. Pt states she is not diabetic.  

## 2017-05-09 ENCOUNTER — Ambulatory Visit (HOSPITAL_COMMUNITY)
Admission: RE | Admit: 2017-05-09 | Discharge: 2017-05-09 | Disposition: A | Discharge status: Home / Self Care | Payer: 59 | Source: Ambulatory Visit | Attending: General Surgery | Admitting: General Surgery

## 2017-05-09 ENCOUNTER — Encounter (HOSPITAL_COMMUNITY)
Admission: RE | Disposition: A | Discharge status: Home / Self Care | Payer: Self-pay | Source: Ambulatory Visit | Attending: General Surgery

## 2017-05-09 ENCOUNTER — Encounter (HOSPITAL_COMMUNITY): Payer: Self-pay | Admitting: Urology

## 2017-05-09 ENCOUNTER — Ambulatory Visit (HOSPITAL_COMMUNITY): Payer: 59 | Admitting: Anesthesiology

## 2017-05-09 DIAGNOSIS — K5900 Constipation, unspecified: Secondary | ICD-10-CM | POA: Insufficient documentation

## 2017-05-09 DIAGNOSIS — Z9049 Acquired absence of other specified parts of digestive tract: Secondary | ICD-10-CM | POA: Insufficient documentation

## 2017-05-09 DIAGNOSIS — Z9851 Tubal ligation status: Secondary | ICD-10-CM | POA: Diagnosis not present

## 2017-05-09 DIAGNOSIS — Z880 Allergy status to penicillin: Secondary | ICD-10-CM | POA: Insufficient documentation

## 2017-05-09 DIAGNOSIS — D170 Benign lipomatous neoplasm of skin and subcutaneous tissue of head, face and neck: Secondary | ICD-10-CM | POA: Diagnosis not present

## 2017-05-09 DIAGNOSIS — Z91018 Allergy to other foods: Secondary | ICD-10-CM | POA: Diagnosis not present

## 2017-05-09 DIAGNOSIS — D1739 Benign lipomatous neoplasm of skin and subcutaneous tissue of other sites: Secondary | ICD-10-CM | POA: Diagnosis not present

## 2017-05-09 HISTORY — DX: Other specified health status: Z78.9

## 2017-05-09 HISTORY — PX: EXCISION MASS NECK: SHX6703

## 2017-05-09 LAB — CBC WITH DIFFERENTIAL/PLATELET
BASOS PCT: 1 %
Basophils Absolute: 0 10*3/uL (ref 0.0–0.1)
Eosinophils Absolute: 0.1 10*3/uL (ref 0.0–0.7)
Eosinophils Relative: 2 %
HEMATOCRIT: 36.8 % (ref 36.0–46.0)
HEMOGLOBIN: 11.8 g/dL — AB (ref 12.0–15.0)
LYMPHS ABS: 3.4 10*3/uL (ref 0.7–4.0)
LYMPHS PCT: 52 %
MCH: 26 pg (ref 26.0–34.0)
MCHC: 32.1 g/dL (ref 30.0–36.0)
MCV: 81.2 fL (ref 78.0–100.0)
MONOS PCT: 7 %
Monocytes Absolute: 0.5 10*3/uL (ref 0.1–1.0)
NEUTROS ABS: 2.4 10*3/uL (ref 1.7–7.7)
NEUTROS PCT: 38 %
Platelets: 300 10*3/uL (ref 150–400)
RBC: 4.53 MIL/uL (ref 3.87–5.11)
RDW: 15.2 % (ref 11.5–15.5)
WBC: 6.5 10*3/uL (ref 4.0–10.5)

## 2017-05-09 LAB — COMPREHENSIVE METABOLIC PANEL
ALBUMIN: 3.2 g/dL — AB (ref 3.5–5.0)
ALK PHOS: 39 U/L (ref 38–126)
ALT: 14 U/L (ref 14–54)
ANION GAP: 6 (ref 5–15)
AST: 21 U/L (ref 15–41)
BILIRUBIN TOTAL: 0.6 mg/dL (ref 0.3–1.2)
BUN: 8 mg/dL (ref 6–20)
CALCIUM: 8.6 mg/dL — AB (ref 8.9–10.3)
CO2: 21 mmol/L — ABNORMAL LOW (ref 22–32)
Chloride: 109 mmol/L (ref 101–111)
Creatinine, Ser: 0.75 mg/dL (ref 0.44–1.00)
GFR calc Af Amer: >60 mL/min (ref >60)
GLUCOSE: 100 mg/dL — AB (ref 65–99)
Potassium: 3.9 mmol/L (ref 3.5–5.1)
Sodium: 136 mmol/L (ref 135–145)
TOTAL PROTEIN: 7.2 g/dL (ref 6.5–8.1)

## 2017-05-09 LAB — HCG, SERUM, QUALITATIVE: PREG SERUM: NEGATIVE

## 2017-05-09 SURGERY — EXCISION, MASS, NECK
Anesthesia: General | Site: Neck

## 2017-05-09 MED ORDER — MIDAZOLAM HCL 2 MG/2ML IJ SOLN
INTRAMUSCULAR | Status: DC | PRN
  Administered 2017-05-09: 2 mg via INTRAVENOUS

## 2017-05-09 MED ORDER — LIDOCAINE 2% (20 MG/ML) 5 ML SYRINGE
INTRAMUSCULAR | Status: DC | PRN
  Administered 2017-05-09: 100 mg via INTRAVENOUS

## 2017-05-09 MED ORDER — ACETAMINOPHEN 500 MG PO TABS
1000.0000 mg | ORAL_TABLET | ORAL | Status: AC
  Administered 2017-05-09: 1000 mg via ORAL
  Filled 2017-05-09: qty 2

## 2017-05-09 MED ORDER — DEXAMETHASONE SODIUM PHOSPHATE 10 MG/ML IJ SOLN
INTRAMUSCULAR | Status: AC
  Filled 2017-05-09: qty 1

## 2017-05-09 MED ORDER — FENTANYL CITRATE (PF) 250 MCG/5ML IJ SOLN
INTRAMUSCULAR | Status: AC
Start: 2017-05-09 — End: 2017-05-09
  Filled 2017-05-09: qty 5

## 2017-05-09 MED ORDER — SUGAMMADEX SODIUM 200 MG/2ML IV SOLN
INTRAVENOUS | Status: DC | PRN
  Administered 2017-05-09: 400 mg via INTRAVENOUS

## 2017-05-09 MED ORDER — FENTANYL CITRATE (PF) 250 MCG/5ML IJ SOLN
INTRAMUSCULAR | Status: DC | PRN
  Administered 2017-05-09: 50 ug via INTRAVENOUS
  Administered 2017-05-09: 100 ug via INTRAVENOUS

## 2017-05-09 MED ORDER — ROCURONIUM BROMIDE 10 MG/ML (PF) SYRINGE
PREFILLED_SYRINGE | INTRAVENOUS | Status: AC
Start: 2017-05-09 — End: 2017-05-09
  Filled 2017-05-09: qty 5

## 2017-05-09 MED ORDER — ONDANSETRON HCL 4 MG/2ML IJ SOLN
INTRAMUSCULAR | Status: DC | PRN
  Administered 2017-05-09: 4 mg via INTRAVENOUS

## 2017-05-09 MED ORDER — EPHEDRINE SULFATE-NACL 50-0.9 MG/10ML-% IV SOSY
PREFILLED_SYRINGE | INTRAVENOUS | Status: DC | PRN
  Administered 2017-05-09: 10 mg via INTRAVENOUS

## 2017-05-09 MED ORDER — CHLORHEXIDINE GLUCONATE CLOTH 2 % EX PADS: 6.0000 | MEDICATED_PAD | Freq: Once | CUTANEOUS | Status: DC

## 2017-05-09 MED ORDER — PROPOFOL 10 MG/ML IV BOLUS
INTRAVENOUS | Status: DC | PRN
  Administered 2017-05-09: 200 mg via INTRAVENOUS

## 2017-05-09 MED ORDER — SUCCINYLCHOLINE CHLORIDE 200 MG/10ML IV SOSY
PREFILLED_SYRINGE | INTRAVENOUS | Status: AC
  Filled 2017-05-09: qty 10

## 2017-05-09 MED ORDER — LACTATED RINGERS IV SOLN
INTRAVENOUS | Status: DC
  Administered 2017-05-09 (×2): via INTRAVENOUS

## 2017-05-09 MED ORDER — ONDANSETRON HCL 4 MG/2ML IJ SOLN: 4.0000 mg | Freq: Once | INTRAMUSCULAR | Status: DC | PRN

## 2017-05-09 MED ORDER — MEPERIDINE HCL 25 MG/ML IJ SOLN: 6.2500 mg | INTRAMUSCULAR | Status: DC | PRN

## 2017-05-09 MED ORDER — DEXTROSE 5 % IV SOLN
3.0000 g | INTRAVENOUS | Status: AC
  Administered 2017-05-09: 3 g via INTRAVENOUS
  Filled 2017-05-09: qty 3000

## 2017-05-09 MED ORDER — DEXAMETHASONE SODIUM PHOSPHATE 10 MG/ML IJ SOLN
INTRAMUSCULAR | Status: DC | PRN
  Administered 2017-05-09: 10 mg via INTRAVENOUS

## 2017-05-09 MED ORDER — EPHEDRINE 5 MG/ML INJ
INTRAVENOUS | Status: AC
  Filled 2017-05-09: qty 10

## 2017-05-09 MED ORDER — ONDANSETRON HCL 4 MG/2ML IJ SOLN
INTRAMUSCULAR | Status: AC
  Filled 2017-05-09: qty 2

## 2017-05-09 MED ORDER — FENTANYL CITRATE (PF) 100 MCG/2ML IJ SOLN: 25.0000 ug | INTRAMUSCULAR | Status: DC | PRN

## 2017-05-09 MED ORDER — CELECOXIB 200 MG PO CAPS
400.0000 mg | ORAL_CAPSULE | ORAL | Status: AC
  Administered 2017-05-09: 400 mg via ORAL
  Filled 2017-05-09: qty 2

## 2017-05-09 MED ORDER — LIDOCAINE 2% (20 MG/ML) 5 ML SYRINGE
INTRAMUSCULAR | Status: AC
  Filled 2017-05-09: qty 5

## 2017-05-09 MED ORDER — LIDOCAINE-EPINEPHRINE (PF) 1 %-1:200000 IJ SOLN
INTRAMUSCULAR | Status: DC | PRN
  Administered 2017-05-09: 6 mL

## 2017-05-09 MED ORDER — PHENYLEPHRINE 40 MCG/ML (10ML) SYRINGE FOR IV PUSH (FOR BLOOD PRESSURE SUPPORT)
PREFILLED_SYRINGE | INTRAVENOUS | Status: AC
  Filled 2017-05-09: qty 10

## 2017-05-09 MED ORDER — MIDAZOLAM HCL 2 MG/2ML IJ SOLN
INTRAMUSCULAR | Status: AC
  Filled 2017-05-09: qty 2

## 2017-05-09 MED ORDER — PROPOFOL 10 MG/ML IV BOLUS
INTRAVENOUS | Status: AC
  Filled 2017-05-09: qty 20

## 2017-05-09 MED ORDER — 0.9 % SODIUM CHLORIDE (POUR BTL) OPTIME
TOPICAL | Status: DC | PRN
  Administered 2017-05-09: 1000 mL

## 2017-05-09 MED ORDER — SUGAMMADEX SODIUM 500 MG/5ML IV SOLN
INTRAVENOUS | Status: AC
  Filled 2017-05-09: qty 5

## 2017-05-09 MED ORDER — SUCCINYLCHOLINE CHLORIDE 200 MG/10ML IV SOSY
PREFILLED_SYRINGE | INTRAVENOUS | Status: DC | PRN
  Administered 2017-05-09: 200 mg via INTRAVENOUS

## 2017-05-09 MED ORDER — ROCURONIUM BROMIDE 10 MG/ML (PF) SYRINGE
PREFILLED_SYRINGE | INTRAVENOUS | Status: DC | PRN
  Administered 2017-05-09: 10 mg via INTRAVENOUS
  Administered 2017-05-09: 50 mg via INTRAVENOUS

## 2017-05-09 MED ORDER — HYDROCODONE-ACETAMINOPHEN 5-325 MG PO TABS: 1.0000 | ORAL_TABLET | Freq: Four times a day (QID) | ORAL | 0 refills | Status: DC | PRN

## 2017-05-09 MED ORDER — PHENYLEPHRINE 40 MCG/ML (10ML) SYRINGE FOR IV PUSH (FOR BLOOD PRESSURE SUPPORT)
PREFILLED_SYRINGE | INTRAVENOUS | Status: DC | PRN
  Administered 2017-05-09: 80 ug via INTRAVENOUS
  Administered 2017-05-09: 160 ug via INTRAVENOUS
  Administered 2017-05-09: 80 ug via INTRAVENOUS

## 2017-05-09 MED FILL — HYDROCODON-APAP 5-325: 5-325 | 4 days supply | Qty: 30 | Fill #0

## 2017-05-09 SURGICAL SUPPLY — 50 items
BENZOIN TINCTURE PRP APPL 2/3 (GAUZE/BANDAGES/DRESSINGS) ×3 IMPLANT
BIOPATCH RED 1 DISK 7.0 (GAUZE/BANDAGES/DRESSINGS) ×2 IMPLANT
BIOPATCH RED 1IN DISK 7.0MM (GAUZE/BANDAGES/DRESSINGS) ×1
BLADE SURG 10 STRL SS (BLADE) ×3 IMPLANT
BLADE SURG 15 STRL LF DISP TIS (BLADE) ×1 IMPLANT
BLADE SURG 15 STRL SS (BLADE) ×2
CANISTER SUCT 3000ML PPV (MISCELLANEOUS) IMPLANT
CHLORAPREP W/TINT 26ML (MISCELLANEOUS) ×3 IMPLANT
CLOSURE WOUND 1/2 X4 (GAUZE/BANDAGES/DRESSINGS) ×1
COVER SURGICAL LIGHT HANDLE (MISCELLANEOUS) ×3 IMPLANT
DECANTER SPIKE VIAL GLASS SM (MISCELLANEOUS) IMPLANT
DRAIN CHANNEL 15F RND FF W/TCR (WOUND CARE) IMPLANT
DRAIN CHANNEL 19F RND (DRAIN) ×3 IMPLANT
DRAPE LAPAROTOMY 100X72 PEDS (DRAPES) ×3 IMPLANT
DRAPE UTILITY XL STRL (DRAPES) ×6 IMPLANT
ELECT CAUTERY BLADE 6.4 (BLADE) ×3 IMPLANT
ELECT REM PT RETURN 9FT ADLT (ELECTROSURGICAL) ×3
ELECTRODE REM PT RTRN 9FT ADLT (ELECTROSURGICAL) ×1 IMPLANT
EVACUATOR SILICONE 100CC (DRAIN) ×3 IMPLANT
GAUZE SPONGE 4X4 12PLY STRL (GAUZE/BANDAGES/DRESSINGS) ×3 IMPLANT
GAUZE SPONGE 4X4 16PLY XRAY LF (GAUZE/BANDAGES/DRESSINGS) IMPLANT
GLOVE EUDERMIC 7 POWDERFREE (GLOVE) ×3 IMPLANT
GOWN STRL REUS W/ TWL LRG LVL3 (GOWN DISPOSABLE) ×1 IMPLANT
GOWN STRL REUS W/ TWL XL LVL3 (GOWN DISPOSABLE) ×1 IMPLANT
GOWN STRL REUS W/TWL LRG LVL3 (GOWN DISPOSABLE) ×2
GOWN STRL REUS W/TWL XL LVL3 (GOWN DISPOSABLE) ×2
KIT ROOM TURNOVER OR (KITS) ×3 IMPLANT
NEEDLE HYPO 25GX1X1/2 BEV (NEEDLE) ×3 IMPLANT
NS IRRIG 1000ML POUR BTL (IV SOLUTION) ×3 IMPLANT
PACK SURGICAL SETUP 50X90 (CUSTOM PROCEDURE TRAY) ×3 IMPLANT
PAD ARMBOARD 7.5X6 YLW CONV (MISCELLANEOUS) ×6 IMPLANT
PENCIL BUTTON HOLSTER BLD 10FT (ELECTRODE) ×3 IMPLANT
SPECIMEN JAR SMALL (MISCELLANEOUS) ×3 IMPLANT
SPONGE LAP 4X18 X RAY DECT (DISPOSABLE) ×6 IMPLANT
STAPLER VISISTAT 35W (STAPLE) IMPLANT
STRIP CLOSURE SKIN 1/2X4 (GAUZE/BANDAGES/DRESSINGS) ×2 IMPLANT
SUT ETHILON 3 0 PS 1 (SUTURE) ×3 IMPLANT
SUT MNCRL AB 3-0 PS2 18 (SUTURE) ×3 IMPLANT
SUT MNCRL AB 4-0 PS2 18 (SUTURE) IMPLANT
SUT VIC AB 3-0 SH 27 (SUTURE) ×2
SUT VIC AB 3-0 SH 27XBRD (SUTURE) ×1 IMPLANT
SUT VIC AB 3-0 SH 8-18 (SUTURE) ×3 IMPLANT
SYR BULB 3OZ (MISCELLANEOUS) ×3 IMPLANT
SYR CONTROL 10ML LL (SYRINGE) ×3 IMPLANT
TOWEL OR 17X24 6PK STRL BLUE (TOWEL DISPOSABLE) IMPLANT
TOWEL OR 17X26 10 PK STRL BLUE (TOWEL DISPOSABLE) IMPLANT
TUBE CONNECTING 12'X1/4 (SUCTIONS)
TUBE CONNECTING 12X1/4 (SUCTIONS) IMPLANT
WATER STERILE IRR 1000ML POUR (IV SOLUTION) IMPLANT
YANKAUER SUCT BULB TIP NO VENT (SUCTIONS) ×3 IMPLANT

## 2017-05-09 NOTE — Transfer of Care (Signed)
Immediate Anesthesia Transfer of Care Note  Patient: Sherry Weaver  Procedure(s) Performed: Procedure(s): EXCISION MASS POSTERIOR NECK (N/A)  Patient Location: PACU  Anesthesia Type:General  Level of Consciousness: awake, alert  and oriented  Airway & Oxygen Therapy: Patient Spontanous Breathing and Patient connected to face mask oxygen  Post-op Assessment: Report given to RN and Post -op Vital signs reviewed and stable  Post vital signs: Reviewed and stable  Last Vitals:  Vitals:   05/09/17 0959 05/09/17 1252  BP: (!) 118/53   Pulse: 85   Resp: 20   Temp: 37.1 C (P) 36.6 C    Last Pain:  Vitals:   05/09/17 1252  PainSc: (P) Asleep      Patients Stated Pain Goal: 3 (16/83/72 9021)  Complications: No apparent anesthesia complications

## 2017-05-09 NOTE — Anesthesia Preprocedure Evaluation (Signed)
Anesthesia Evaluation  Patient identified by MRN, date of birth, ID band Patient awake    Reviewed: Allergy & Precautions, NPO status , Patient's Chart, lab work & pertinent test results  Airway Mallampati: II  TM Distance: >3 FB Neck ROM: Full    Dental no notable dental hx.    Pulmonary neg pulmonary ROS,    Pulmonary exam normal breath sounds clear to auscultation       Cardiovascular negative cardio ROS Normal cardiovascular exam Rhythm:Regular Rate:Normal     Neuro/Psych negative neurological ROS  negative psych ROS   GI/Hepatic negative GI ROS, Neg liver ROS,   Endo/Other  negative endocrine ROS  Renal/GU negative Renal ROS  negative genitourinary   Musculoskeletal negative musculoskeletal ROS (+)   Abdominal   Peds negative pediatric ROS (+)  Hematology negative hematology ROS (+)   Anesthesia Other Findings   Reproductive/Obstetrics negative OB ROS                             Anesthesia Physical Anesthesia Plan  ASA: II  Anesthesia Plan: General   Post-op Pain Management:    Induction: Intravenous  PONV Risk Score and Plan: 2 and Ondansetron, Dexamethasone, Treatment may vary due to age or medical condition and Midazolam  Airway Management Planned: Oral ETT and LMA  Additional Equipment:   Intra-op Plan:   Post-operative Plan: Extubation in OR  Informed Consent: I have reviewed the patients History and Physical, chart, labs and discussed the procedure including the risks, benefits and alternatives for the proposed anesthesia with the patient or authorized representative who has indicated his/her understanding and acceptance.   Dental advisory given  Plan Discussed with:   Anesthesia Plan Comments: (  )        Anesthesia Quick Evaluation

## 2017-05-09 NOTE — Discharge Instructions (Signed)
Keep a written record of the drainage and bring that to the office with you. Sponge bathe for 2-3 days You may take a shower after 3 days if desired  Change the bandage with clean, dry 4 x 4's every 2-3 days or if wet or soiled.  Ice pack to wound for 10 minutes at a time.  Intermittently, for 24 hours  You may walk around the block.  You may walk up stairs.  Due to neck mobility I would not drive a car for about one week.  You may ride in a car as much as you wish, however.  The pain medicine might make you constipated.  I would take a laxative or stool softener every day.

## 2017-05-09 NOTE — Interval H&P Note (Signed)
History and Physical Interval Note:  05/09/2017 9:31 AM  Sherry Weaver  has presented today for surgery, with the diagnosis of LIPOMA  The various methods of treatment have been discussed with the patient and family. After consideration of risks, benefits and other options for treatment, the patient has consented to  Procedure(s): EXCISION MASS POSTERIOR NECK (N/A) as a surgical intervention .  The patient's history has been reviewed, patient examined, no change in status, stable for surgery.  I have reviewed the patient's chart and labs.  Questions were answered to the patient's satisfaction.     [name]

## 2017-05-09 NOTE — Op Note (Signed)
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 She is referred by Clent Demark, PA at [facility], for evaluation management of a huge lipoma of the posterior neck.      She states that she has had a palpable mass on her lower posterior neck for over 4 years. This has been enlarging. She also reports numbness and tingling in both arms from shoulder to hand, intermittent. No pain. The mass on the neck has never been tender or inflamed.       Comorbidities include BMI 47, paresthesias of both arms, borderline hypertension, constipation, history of open appendectomy, history laparoscopic tubal ligation       She states she would like the mass removed from her neck regardless because its enlarging and very prominent and visible. That is reasonable. I told her that the numbness and tingling is very unlikely to be caused by the lipoma, and I suggested that it would need to be evaluated by a neurologist or a neurosurgeon. She understands that well.   I discussed the indications, details, techniques, and numerous risk of the surgery with her. She is aware of the risk of bleeding, infection, drain placement, skin necrosis, seroma formation, nerve damage with chronic pain or numbness. She understands these issues well. All her questions were answered. She agrees with this plan.  Operative Findings:       There was a large, multilobulated lipoma in the  posterior neck going all the way down to the deep posterior neck fascia.  This was at least 12 cm diameter once it was removed.  Gross appearance was completely benign.  Procedure in Detail:          Following the induction of general endotracheal anesthesia the patient was carefully rotated and positioned in a prone position with appropriate padding and airway control.  The neck was flexed.  The neck and upper back was then prepped and draped in a sterile fashion.  Surgical timeout was performed.  Intravenous antibiotics were given.  0.5% Marcaine with epinephrine was used as local infiltration anesthetic.     A transverse incision was made overlying the prominent palpable mass.  Dissection was carried down through the dermis and superficial subcutaneous tissue.  At that point we encountered a multilobulated thinly encapsulated lipoma which we slowly dissected away from all of the surrounding skin and soft tissue all the way down and then dissected off of the deep fascia of the posterior neck.  The mass was sent to the lab.  Hemostasis was excellent and achieved with  electrocautery.  The wound was irrigated with saline.  This left a huge space that I could not close down and so I  placed a 19 [place] Blake drain in the wound and brought that out through a separate stab incision to the right and inferior to the corner of the incision.  This was sutured to the skin and connected to suction bulb.  Subcutaneous tissue was closed with 3-0 Vicryl sutures and the skin closed with a running subcuticular 3-0 Monocryl and Steri-Strips.  Clean bandages were placed and the patient taken to PACU in stable condition.  EBL 20 mL or less.  Counts correct.  Complications none.     Edsel Petrin. Dalbert Batman, M.D., FACS General and Minimally Invasive Surgery Breast and Colorectal Surgery   Addendum: I logged onto the [facility] website and reviewed her prescription medication history.  05/09/2017 12:41 PM

## 2017-05-09 NOTE — Interval H&P Note (Signed)
History and Physical Interval Note:  05/09/2017 10:37 AM  Sherry Weaver  has presented today for surgery, with the diagnosis of LIPOMA  The various methods of treatment have been discussed with the patient and family. After consideration of risks, benefits and other options for treatment, the patient has consented to  Procedure(s): EXCISION MASS POSTERIOR NECK (N/A) as a surgical intervention .  The patient's history has been reviewed, patient examined, no change in status, stable for surgery.  I have reviewed the patient's chart and labs.  Questions were answered to the patient's satisfaction.     [name]

## 2017-05-09 NOTE — Anesthesia Procedure Notes (Signed)
Procedure Name: Intubation Date/Time: 05/09/2017 11:27 AM Performed by: Teressa Lower Pre-anesthesia Checklist: Patient identified, Emergency Drugs available, Suction available and Patient being monitored Patient Re-evaluated:Patient Re-evaluated prior to induction Oxygen Delivery Method: Circle system utilized Preoxygenation: Pre-oxygenation with 100% oxygen Induction Type: IV induction Ventilation: Mask ventilation without difficulty Laryngoscope Size: Mac and 3 Grade View: Grade II Tube type: Oral Tube size: 7.0 mm Number of attempts: 1 Airway Equipment and Method: Stylet and Oral airway Placement Confirmation: ETT inserted through vocal cords under direct vision,  positive ETCO2 and breath sounds checked- equal and bilateral Secured at: 22 cm Tube secured with: Tape Dental Injury: Teeth and Oropharynx as per pre-operative assessment

## 2017-05-09 NOTE — Anesthesia Postprocedure Evaluation (Signed)
Anesthesia Post Note  Patient: Sherry Weaver  Procedure(s) Performed: Procedure(s) (LRB): EXCISION MASS POSTERIOR NECK (N/A)     Patient location during evaluation: PACU Anesthesia Type: General Level of consciousness: awake and alert Pain management: pain level controlled Vital Signs Assessment: post-procedure vital signs reviewed and stable Respiratory status: spontaneous breathing, nonlabored ventilation, respiratory function stable and patient connected to nasal cannula oxygen Cardiovascular status: blood pressure returned to baseline and stable Postop Assessment: no signs of nausea or vomiting Anesthetic complications: no    Last Vitals:  Vitals:   05/09/17 1307 05/09/17 1320  BP: 134/87 (!) 137/94  Pulse: 84 81  Resp: 14 17  Temp:      Last Pain:  Vitals:   05/09/17 1320  PainSc: 0-No pain                 Maxemiliano Riel

## 2017-05-10 ENCOUNTER — Encounter (HOSPITAL_COMMUNITY): Payer: Self-pay | Admitting: General Surgery

## 2017-05-10 NOTE — Progress Notes (Signed)
Inform patient of Pathology report,.Neck mass pathology confirms completely benign lipoma. Good news. Will discuss in detail at next OV (next week).  hmi

## 2017-11-08 ENCOUNTER — Ambulatory Visit (INDEPENDENT_AMBULATORY_CARE_PROVIDER_SITE_OTHER): Payer: 59 | Admitting: Physician Assistant

## 2017-11-08 ENCOUNTER — Encounter (INDEPENDENT_AMBULATORY_CARE_PROVIDER_SITE_OTHER): Payer: Self-pay | Admitting: Physician Assistant

## 2017-11-08 VITALS — Ht 63.0 in | Wt 267.6 lb

## 2017-11-08 DIAGNOSIS — R220 Localized swelling, mass and lump, head: Secondary | ICD-10-CM | POA: Diagnosis not present

## 2017-11-08 MED ORDER — HYDROXYZINE HCL 25 MG PO TABS: 25.0000 mg | ORAL_TABLET | Freq: Two times a day (BID) | ORAL | 0 refills | Status: DC | PRN

## 2017-11-08 MED ORDER — METHYLPREDNISOLONE SODIUM SUCC 125 MG IJ SOLR
125.0000 mg | Freq: Once | INTRAMUSCULAR | Status: AC
  Administered 2017-11-08: 125 mg via INTRAMUSCULAR

## 2017-11-08 MED ORDER — DOXYCYCLINE HYCLATE 100 MG PO TABS
100.0000 mg | ORAL_TABLET | Freq: Two times a day (BID) | ORAL | 0 refills | Status: DC
Start: 2017-11-08 — End: 2018-05-07

## 2017-11-08 MED FILL — hydrOXYzine HCL 25 MG TABS: 25 | 7 days supply | Qty: 14 | Fill #0

## 2017-11-08 MED FILL — DOXYCYCLINE HYCLATE 100 MG: 100 | 10 days supply | Qty: 20 | Fill #0

## 2017-11-08 NOTE — Patient Instructions (Signed)
Anaphylactic Reaction An anaphylactic reaction (anaphylaxis) is a sudden allergic reaction that is very bad (severe). It also affects more than one part of the body. This condition can be life-threatening. If you have an anaphylactic reaction, you need to get medical help right away. You may need to stay in the hospital. Your doctor may teach you how to use an allergy kit (anaphylaxis kit) and how to give yourself an allergy shot (epinephrine injection). You can give yourself an allergy shot with what is commonly called an auto-injector "pen." Symptoms of an anaphylactic reaction may include:  A stuffy nose (nasal congestion).  Headache.  Tingling in your mouth.  A flushed face.  An itchy, red rash.  Swelling of your eyes, lips, face, or tongue.  Swelling of the back of your mouth and your throat.  Breathing loudly (wheezing).  A hoarse voice.  Itchy, red, swollen areas of skin (hives).  Dizziness or light-headedness.  Passing out (fainting).  Feeling worried or nervous (anxiety).  Feeling confused.  Pain in your belly (abdomen) or chest.  Trouble with breathing, talking, or swallowing.  A tight feeling in your chest or throat.  Fast or uneven heartbeats (palpitations).  Throwing up (vomiting).  Watery poop (diarrhea).  Follow these instructions at home: Safety  Always keep an auto-injector pen or your allergy kit with you. These could save your life. Use them as told by your doctor.  Do not drive until your doctor says that it is safe.  Make sure that you, the people who live with you, and your employer know: ? How to use your allergy kit. ? How to use an auto-injector pen to give you an allergy shot.  If you used your auto-injector pen: ? Get more medicine for it right away. This is important in case you have another reaction. ? Get help right away.  Wear a bracelet or necklace that says you have an allergy, if your doctor tells you to do this.  Learn  the signs of a very bad allergic reaction.  Work with your doctors to make a plan for what to do if you have a very bad allergic reaction. Being prepared is important. General instructions  Take over-the-counter and prescription medicines only as told by your doctor.  If you have itchy, red, swollen areas of skin or a rash: ? Use over-the-counter medicine (antihistamine) as told by your doctor. ? Put cold, wet cloths (cold compresses) on your skin. ? Take baths or showers in cool water. Avoid hot water.  If you had tests done, it is up to you to get your test results. Ask your doctor when your results will be ready.  Tell any doctors who care for you that you have an allergy.  Keep all follow-up visits as told by your doctor. This is important. How is this prevented?  Avoid things (allergens) that gave you a very bad allergic reaction before.  If you have a food allergy and you go to a restaurant, tell your server about your allergy. If you are not sure if your meal was made with food that you are allergic to, ask your server before you eat it. Contact a doctor if:  You have symptoms of an allergic reaction. You may notice them soon after being around whatever it is that you are allergic to. Symptoms may include: ? A rash. ? A headache. ? Sneezing or a runny nose. ? Swelling. ? Feeling sick to your stomach. ? Watery poop. Get help right   away if:  You had to use your auto-injector pen. You must go to the emergency room even if the medicine seems to be working.  You have any of these: ? A tight feeling in your chest or your throat. ? Loud breathing. ? Trouble with breathing. ? Itchy, red, swollen areas of skin. ? Red skin or itching all over your body. ? Swelling in your lips, tongue, or the back of your throat.  You have throwing up that gets very bad.  You have watery poop that gets very bad.  You pass out or feel like you might pass out. These symptoms may be an  emergency. Do not wait to see if the symptoms will go away. Use your auto-injector pen or allergy kit as you have been told. Get medical help right away. Call your local emergency services (911 in the [place] Do not drive yourself to the hospital. Summary  An anaphylactic reaction (anaphylaxis) is a sudden allergic reaction that is very bad (severe).  This condition can be life-threatening. If you have an anaphylactic reaction, you need to get medical help right away.  Your doctor may teach you how to use an allergy kit (anaphylaxis kit) and how to give yourself an allergy shot (epinephrine injection) with an auto-injector "pen."  Always keep an auto-injector pen or your allergy kit with you. These could save your life. Use them as told by your doctor.  If you had to use your auto-injector pen, you must go to the emergency room even if the medicine seems to be working. This information is not intended to replace advice given to you by your health care provider. Make sure you discuss any questions you have with your health care provider. Document Released: 03/07/2008 Document Revised: 05/13/2016 Document Reviewed: 05/13/2016 Elsevier Interactive Patient Education  2017 [employer].

## 2017-11-08 NOTE — Progress Notes (Signed)
Subjective:  Patient ID: Sherry Weaver, female    DOB: 08/21/1971  Age: 47 y.o. MRN: [id number]  CC: lip swelling  HPI Sherry Weaver is a 47 y.o. female with a medical history of obesity presents with three day history of bottom lip swelling. Not attributed to anything in particular. Says she has been using benadryl which helped reduced swelling of the left aspect of bottom lip. Has noted a break in the skin of the right aspect of bottom lip. The break in skin has been suppurating. Has an Epipen but did not feel she needed to use it. Does not endorse rash, swelling elsewhere, SOB, abdominal pain, nausea, vomiting, or itching. No other complaints.        Outpatient Medications Prior to Visit  Medication Sig Dispense Refill  . EPINEPHrine (EPIPEN 2-PAK) 0.3 mg/0.3 mL IJ SOAJ injection Inject 0.3 mLs (0.3 mg total) into the muscle once as needed (for severe allergic reaction). CAll 911 immediately if you have to use this medicine 1 Device 1  . HYDROcodone-acetaminophen (NORCO) 5-325 MG tablet Take 1-2 tablets by mouth every 6 (six) hours as needed for moderate pain or severe pain. 30 tablet 0   No facility-administered medications prior to visit.      ROS Review of Systems  Constitutional: Negative for chills, fever and malaise/fatigue.  Eyes: Negative for blurred vision.  Respiratory: Negative for shortness of breath.   Cardiovascular: Negative for chest pain and palpitations.  Gastrointestinal: Negative for abdominal pain and nausea.  Genitourinary: Negative for dysuria and hematuria.  Musculoskeletal: Negative for joint pain and myalgias.  Skin: Negative for rash.       Bottom lip swelling  Neurological: Negative for tingling and headaches.  Psychiatric/Behavioral: Negative for depression. The patient is not nervous/anxious.     Objective:  There were no vitals taken for this visit.  BP/Weight 05/09/2017 05/03/2017 2/35/3614  Systolic BP 431 540 086  Diastolic BP 82 79  79  Wt. (Lbs) 263 263.2 -  BMI 45.86 45.89 -      Physical Exam  Constitutional: She is oriented to person, place, and time.  Well developed, obese, NAD, polite  HENT:  Head: Normocephalic and atraumatic.  Bottom lip with moderate swelling more pronounced on the right side than left. Small area of crusting on the right aspect of the bottom lip. Tonsils 1+, no tongue edema.  Eyes: No scleral icterus.  Neck: Normal range of motion. Neck supple.  Cardiovascular: Normal rate, regular rhythm and normal heart sounds.  Pulmonary/Chest: Effort normal. No respiratory distress. She has no wheezes. She has no rales.  Abdominal: Soft. Bowel sounds are normal. There is no tenderness.  Musculoskeletal: She exhibits no edema.  Lymphadenopathy:    She has no cervical adenopathy.  Neurological: She is alert and oriented to person, place, and time. No cranial nerve deficit. Coordination normal.  Skin: Skin is warm and dry. No rash noted. No erythema. No pallor.  Psychiatric: She has a normal mood and affect. Her behavior is normal. Thought content normal.  Vitals reviewed.    Assessment & Plan:    1. Lip swelling - Begin doxycycline (VIBRA-TABS) 100 MG tablet; Take 1 tablet (100 mg total) by mouth 2 (two) times daily.  Dispense: 20 tablet; Refill: 0 - Administered methylPREDNISolone sodium succinate (SOLU-MEDROL) 125 mg/2 mL injection 125 mg - Begin hydrOXYzine (ATARAX/VISTARIL) 25 MG tablet; Take 1 tablet (25 mg total) by mouth 2 (two) times daily as needed.  Dispense: 14 tablet; Refill: 0  Meds ordered this encounter  Medications  . doxycycline (VIBRA-TABS) 100 MG tablet    Sig: Take 1 tablet (100 mg total) by mouth 2 (two) times daily.    Dispense:  20 tablet    Refill:  0    Order Specific Question:   Supervising Provider    Answer:   Tresa Garter [id number]  . methylPREDNISolone sodium succinate (SOLU-MEDROL) 125 mg/2 mL injection 125 mg  . hydrOXYzine (ATARAX/VISTARIL) 25 MG  tablet    Sig: Take 1 tablet (25 mg total) by mouth 2 (two) times daily as needed.    Dispense:  14 tablet    Refill:  0    Order Specific Question:   Supervising Provider    Answer:   Tresa Garter [id number]    Follow-up: Return if symptoms worsen or fail to improve.   Clent Demark PA

## 2018-04-17 ENCOUNTER — Emergency Department (HOSPITAL_COMMUNITY): Payer: 59

## 2018-04-17 ENCOUNTER — Emergency Department (HOSPITAL_COMMUNITY)
Admission: EM | Admit: 2018-04-17 | Discharge: 2018-04-17 | Disposition: A | Discharge status: Home / Self Care | Payer: 59 | Attending: Emergency Medicine | Admitting: Emergency Medicine

## 2018-04-17 ENCOUNTER — Encounter (HOSPITAL_COMMUNITY): Payer: Self-pay | Admitting: Emergency Medicine

## 2018-04-17 DIAGNOSIS — R0602 Shortness of breath: Secondary | ICD-10-CM | POA: Insufficient documentation

## 2018-04-17 DIAGNOSIS — Z79899 Other long term (current) drug therapy: Secondary | ICD-10-CM | POA: Insufficient documentation

## 2018-04-17 DIAGNOSIS — R0789 Other chest pain: Secondary | ICD-10-CM | POA: Insufficient documentation

## 2018-04-17 DIAGNOSIS — R079 Chest pain, unspecified: Secondary | ICD-10-CM | POA: Diagnosis not present

## 2018-04-17 LAB — I-STAT BETA HCG BLOOD, ED (MC, WL, AP ONLY): I-stat hCG, quantitative: <5 m[IU]/mL (ref <5)

## 2018-04-17 LAB — BASIC METABOLIC PANEL
ANION GAP: 8 (ref 5–15)
BUN: 8 mg/dL (ref 6–20)
CALCIUM: 9 mg/dL (ref 8.9–10.3)
CO2: 23 mmol/L (ref 22–32)
Chloride: 109 mmol/L (ref 98–111)
Creatinine, Ser: 0.95 mg/dL (ref 0.44–1.00)
GFR calc Af Amer: >60 mL/min (ref >60)
GFR calc non Af Amer: >60 mL/min (ref >60)
GLUCOSE: 93 mg/dL (ref 70–99)
Potassium: 3.9 mmol/L (ref 3.5–5.1)
Sodium: 140 mmol/L (ref 135–145)

## 2018-04-17 LAB — CBC
HEMATOCRIT: 37.1 % (ref 36.0–46.0)
HEMOGLOBIN: 11.7 g/dL — AB (ref 12.0–15.0)
MCH: 26.7 pg (ref 26.0–34.0)
MCHC: 31.5 g/dL (ref 30.0–36.0)
MCV: 84.7 fL (ref 78.0–100.0)
PLATELETS: 302 10*3/uL (ref 150–400)
RBC: 4.38 MIL/uL (ref 3.87–5.11)
RDW: 14.7 % (ref 11.5–15.5)
WBC: 8.8 10*3/uL (ref 4.0–10.5)

## 2018-04-17 LAB — I-STAT TROPONIN, ED
Troponin i, poc: 0.01 ng/mL (ref 0.00–0.08)
Troponin i, poc: 0 ng/mL (ref 0.00–0.08)

## 2018-04-17 LAB — D-DIMER, QUANTITATIVE: D-Dimer, Quant: 0.83 ug/mL-FEU — ABNORMAL HIGH (ref 0.00–0.50)

## 2018-04-17 MED ORDER — TRAMADOL HCL 50 MG PO TABS: 50.0000 mg | ORAL_TABLET | Freq: Four times a day (QID) | ORAL | 0 refills | Status: DC | PRN

## 2018-04-17 MED ORDER — IBUPROFEN 800 MG PO TABS: 800.0000 mg | ORAL_TABLET | Freq: Three times a day (TID) | ORAL | 0 refills | Status: DC | PRN

## 2018-04-17 MED ORDER — IOPAMIDOL (ISOVUE-370) INJECTION 76%
100.0000 mL | Freq: Once | INTRAVENOUS | Status: AC | PRN
  Administered 2018-04-17: 100 mL via INTRAVENOUS

## 2018-04-17 MED ORDER — IOPAMIDOL (ISOVUE-370) INJECTION 76%
INTRAVENOUS | Status: AC
  Filled 2018-04-17: qty 100

## 2018-04-17 MED ORDER — KETOROLAC TROMETHAMINE 30 MG/ML IJ SOLN
30.0000 mg | Freq: Once | INTRAMUSCULAR | Status: AC
  Administered 2018-04-17: 30 mg via INTRAVENOUS
  Filled 2018-04-17: qty 1

## 2018-04-17 MED FILL — IBUPROFEN 800 MG TAB: 800 | 7 days supply | Qty: 21 | Fill #0

## 2018-04-17 MED FILL — traMADol HCL 50 MG TABS: 50 | 4 days supply | Qty: 15 | Fill #0

## 2018-04-17 NOTE — Discharge Instructions (Addendum)
Return here as needed.  I would like for you to follow-up with the cardiologist provided for further evaluation of your heart to ensure that this is not an issue.  The testing today did not show any damage to your heart.  The CT scan did not show any abnormality such as blood clot.

## 2018-04-17 NOTE — ED Triage Notes (Signed)
Pt c/o of left sided chest pain that has been going on for approx 2 weeks. Has been taking bayer asa, helps for a while. Shortness of breath and diaphoresis.

## 2018-04-18 NOTE — ED Provider Notes (Addendum)
[facility] EMERGENCY DEPARTMENT Provider Note   CSN: [id number] Arrival date & time: 04/17/18  0040     History   Chief Complaint Chief Complaint  Patient presents with  . Chest Pain    HPI Sherry Weaver is a 47 y.o. female.  HPI Patient presents to the emergency department with chest pain is been ongoing for the past 2 weeks.  Patient states that it has been constant and she has had some mild shortness of breath.  She states she has had no exertional symptoms.  She states the aspirin does seem to help.  Patient states that nothing seems make the condition worse.  The patient denies  headache,blurred vision, neck pain, fever, cough, weakness, numbness, dizziness, anorexia, edema, abdominal pain, nausea, vomiting, diarrhea, rash, back pain, dysuria, hematemesis, bloody stool, near syncope, or syncope. Past Medical History:  Diagnosis Date  . Medical history non-contributory     Patient Active Problem List   Diagnosis Date Noted  . 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 Obesity 01/19/2017  . Constipation 01/19/2017    Past Surgical History:  Procedure Laterality Date  . APPENDECTOMY    . EXCISION MASS NECK N/A 05/09/2017   Procedure: EXCISION MASS POSTERIOR NECK;  Surgeon: Fanny Skates, MD;  Location: [place];  Service: General;  Laterality: N/A;  . TUBAL LIGATION       OB History   None      Home Medications    Prior to Admission medications   Medication Sig Start Date End Date Taking? Authorizing Provider  EPINEPHrine (EPIPEN 2-PAK) 0.3 mg/0.3 mL IJ SOAJ injection Inject 0.3 mLs (0.3 mg total) into the muscle once as needed (for severe allergic reaction). CAll 911 immediately if you have to use this medicine 02/25/17  Yes Montine Circle, PA-C  Multiple Vitamin (MULTIVITAMIN WITH MINERALS) TABS tablet Take 1 tablet by mouth daily.   Yes [provider]  doxycycline (VIBRA-TABS) 100 MG tablet Take 1 tablet (100 mg total) by mouth 2 (two) times  daily. Patient not taking: Reported on 04/17/2018 11/08/17   Clent Demark, PA-C  HYDROcodone-acetaminophen [facility]) 5-325 MG tablet Take 1-2 tablets by mouth every 6 (six) hours as needed for moderate pain or severe pain. Patient not taking: Reported on 04/17/2018 05/09/17   Fanny Skates, MD  hydrOXYzine (ATARAX/VISTARIL) 25 MG tablet Take 1 tablet (25 mg total) by mouth 2 (two) times daily as needed. Patient not taking: Reported on 04/17/2018 11/08/17   Clent Demark, PA-C  ibuprofen (ADVIL,MOTRIN) 800 MG tablet Take 1 tablet (800 mg total) by mouth every 8 (eight) hours as needed. 04/17/18   Carol Loftin, Harrell Gave, PA-C  traMADol (ULTRAM) 50 MG tablet Take 1 tablet (50 mg total) by mouth every 6 (six) hours as needed for severe pain. 04/17/18   Tajanae Guilbault, Harrell Gave, PA-C    Family History No family history on file.  Social History Social History   Tobacco Use  . Smoking status: Never Smoker  . Smokeless tobacco: Never Used  Substance Use Topics  . Alcohol use: No  . Drug use: No     Allergies   Shrimp [shellfish allergy] and Penicillins   Review of Systems Review of Systems All other systems negative except as documented in the HPI. All pertinent positives and negatives as reviewed in the HPI.  Physical Exam Updated Vital Signs BP 122/85   Pulse 84   Temp 98.9 F (37.2 C) (Oral)   Resp 20   Ht 5\' 2"  (1.575 m)  Wt 118.4 kg (261 lb)   LMP 03/25/2018 Comment: tubal  SpO2 100%   BMI 47.74 kg/m   Physical Exam  Constitutional: She is oriented to person, place, and time. She appears well-developed and well-nourished. No distress.  HENT:  Head: Normocephalic and atraumatic.  Mouth/Throat: Oropharynx is clear and moist.  Eyes: Pupils are equal, round, and reactive to light.  Neck: Normal range of motion. Neck supple.  Cardiovascular: Normal rate, regular rhythm, normal heart sounds and normal pulses. Exam reveals no gallop and no friction rub.  No murmur  heard. Pulmonary/Chest: Effort normal and breath sounds normal. No respiratory distress. She has no decreased breath sounds. She has no wheezes. She has no rhonchi. She has no rales.  Abdominal: Soft. Bowel sounds are normal. She exhibits no distension. There is no tenderness.  Neurological: She is alert and oriented to person, place, and time. She exhibits normal muscle tone. Coordination normal.  Skin: Skin is warm and dry. Capillary refill takes less than 2 seconds. No rash noted. No erythema.  Psychiatric: She has a normal mood and affect. Her behavior is normal.  Nursing note and vitals reviewed.    ED Treatments / Results  Labs (all labs ordered are listed, but only abnormal results are displayed) Labs Reviewed  CBC - Abnormal; Notable for the following components:      Result Value   Hemoglobin 11.7 (*)    All other components within normal limits  D-DIMER, QUANTITATIVE (NOT AT [facility]) - Abnormal; Notable for the following components:   D-Dimer, Quant 0.83 (*)    All other components within normal limits  BASIC METABOLIC PANEL  I-STAT TROPONIN, ED  I-STAT BETA HCG BLOOD, ED ([facility], [facility], [facility] ONLY)  I-STAT TROPONIN, ED    EKG EKG Interpretation  Date/Time:  Tuesday April 17 2018 00:49:03 EDT Ventricular Rate:  86 PR Interval:  172 QRS Duration: 68 QT Interval:  348 QTC Calculation: 416 R Axis:   50 Text Interpretation:  Normal sinus rhythm Normal ECG No old tracing to compare Confirmed by Delora Fuel ([id number]) on 04/17/2018 1:50:01 AM Also confirmed by Delora Fuel ([id number]), editor Hattie Perch ([id number])  on 04/17/2018 6:58:43 AM   Radiology Dg Chest 2 View  Result Date: 04/17/2018 CLINICAL DATA:  47 y/o  F; mid chest pain and shortness of breath. EXAM: CHEST - 2 VIEW COMPARISON:  None. FINDINGS: The heart size and mediastinal contours are within normal limits. Both lungs are clear. The visualized skeletal structures are unremarkable. IMPRESSION: No acute pulmonary process  identified. Electronically Signed   By: Kristine Garbe M.D.   On: 04/17/2018 01:57   Ct Angio Chest Pe W/cm &/or Wo Cm  Result Date: 04/17/2018 CLINICAL DATA:  Two week history of intermittent LEFT-sided chest pain associated with shortness of breath and diaphoresis. Elevated D-dimer. Nonsmoker. EXAM: CT ANGIOGRAPHY CHEST WITH CONTRAST TECHNIQUE: Multidetector CT imaging of the chest was performed using the standard protocol during bolus administration of intravenous contrast. Multiplanar CT image reconstructions and MIPs were obtained to evaluate the vascular anatomy. CONTRAST:  173mL ISOVUE-370 IOPAMIDOL INJECTION 76% IV. COMPARISON:  No prior CT. Chest x-ray earlier same day is correlated. FINDINGS: Cardiovascular: Contrast opacification of pulmonary arteries is very good. No filling defects within either main pulmonary artery or their segmental branches in either lung to suggest pulmonary embolism. Heart size normal. No visible coronary atherosclerosis. No pericardial effusion. No visible atherosclerosis involving the thoracic or proximal abdominal aorta or their visualized branches. Mediastinum/Nodes: No pathologically enlarged mediastinal,  hilar or axillary lymph nodes. No mediastinal masses. Normal-appearing esophagus. Thyroid gland mildly enlarged with substernal extension, containing densely calcified nodules, the largest measuring approximately 2 cm involving the lower pole of the LEFT lobe. Lungs/Pleura: Lung parenchyma clear without evidence of airspace consolidation, interstitial disease, or parenchymal nodules or masses. No pleural effusions. Central airways patent with moderate bronchial wall thickening. Upper Abdomen: Unremarkable for the early arterial phase of enhancement. Musculoskeletal: Minimal to mild degenerative changes involving the midthoracic spine. No acute findings. Review of the MIP images confirms the above findings. IMPRESSION: 1. No evidence of pulmonary embolism. 2.  Moderate central bronchial wall thickening indicating bronchitis and/or asthma. No acute cardiopulmonary disease otherwise. 3. Mild thyromegaly with densely calcified nodules involving the lower poles of both lobes of the thyroid gland, the largest measuring approximately 2 cm on the LEFT. Non-emergent thyroid ultrasound is recommended in further evaluation. This follows ACR consensus guidelines: Managing Incidental Thyroid Nodules Detected on Imaging: White Paper of the ACR Incidental Thyroid Findings Committee. J Am Coll Radiol 2015; 12:143-150. Electronically Signed   By: Evangeline Dakin M.D.   On: 04/17/2018 10:11    Procedures Procedures (including critical care time)  Medications Ordered in ED Medications  iopamidol (ISOVUE-370) 76 % injection 100 mL (100 mLs Intravenous Contrast Given 04/17/18 0936)  ketorolac (TORADOL) 30 MG/ML injection 30 mg (30 mg Intravenous Given 04/17/18 1125)     Initial Impression / Assessment and Plan / ED Course  I have reviewed the triage vital signs and the nursing notes.  Pertinent labs & imaging results that were available during my care of the patient were reviewed by me and considered in my medical decision making (see chart for details).     Patient has atypical chest pain for cardiac disease.  There is been components of this that are worse with movements and palpation.   She has a negative CTA of her chest.  Patient is advised to follow-up with her primary doctor told to return here as needed.  Patient agrees with plan and all questions were answered. Final Clinical Impressions(s) / ED Diagnoses   Final diagnoses:  Chest wall pain    ED Discharge Orders        Ordered    ibuprofen (ADVIL,MOTRIN) 800 MG tablet  Every 8 hours PRN     04/17/18 1111    traMADol (ULTRAM) 50 MG tablet  Every 6 hours PRN     04/17/18 1111       Dalia Heading, PA-C 04/18/18 1607    Taggert Bozzi, [place], PA-C 05/08/18 1548    Drenda Freeze,  MD 05/08/18 [phone]

## 2018-05-07 ENCOUNTER — Encounter (INDEPENDENT_AMBULATORY_CARE_PROVIDER_SITE_OTHER): Payer: Self-pay | Admitting: Physician Assistant

## 2018-05-07 ENCOUNTER — Ambulatory Visit (INDEPENDENT_AMBULATORY_CARE_PROVIDER_SITE_OTHER): Payer: 59 | Admitting: Physician Assistant

## 2018-05-07 ENCOUNTER — Encounter

## 2018-05-07 ENCOUNTER — Other Ambulatory Visit: Payer: Self-pay

## 2018-05-07 VITALS — BP 139/98 | HR 72 | Temp 98.2°F | Ht 63.0 in | Wt 269.8 lb

## 2018-05-07 DIAGNOSIS — Z09 Encounter for follow-up examination after completed treatment for conditions other than malignant neoplasm: Secondary | ICD-10-CM | POA: Diagnosis not present

## 2018-05-07 DIAGNOSIS — R0789 Other chest pain: Secondary | ICD-10-CM

## 2018-05-07 DIAGNOSIS — R7989 Other specified abnormal findings of blood chemistry: Secondary | ICD-10-CM | POA: Diagnosis not present

## 2018-05-07 DIAGNOSIS — E042 Nontoxic multinodular goiter: Secondary | ICD-10-CM | POA: Diagnosis not present

## 2018-05-07 NOTE — Progress Notes (Signed)
Subjective:  Patient ID: Sherry Weaver, female    DOB: 17-Aug-1971  Age: 47 y.o. MRN: [id number]  CC: f/u ED visit  HPI Sherry Weaver is a 47 y.o. female with a medical history of obesity, constipation, s/p appendectomy, and s/p BTL presents on ED f/u for chest pain. Went to ED three weeks ago. ED note shows no HPI. D-dimer 0.83 ug/mL, Hgb 11.7 g/dL. Troponin x2, HCG, and BMP negative. CTA chest was negative for PE, positive for bronchitis/asthma. Incidental finding of thyroid nodules. Diagnosis was chest wall pain. Prescribed Tramadol and Ibuprofen which resolved the pain while she was taking the medications. Has run out of Tramadol and Ibuprofen and says she still has left sided chest pain that is tender to palpation. Chest pain is described as sharp and non-radiating. Also has some "aching" pain over the same region in the chest. Pt has been nervous about her elevated d-dimer reading. Does not endorse any other symptoms or complaints.          Outpatient Medications Prior to Visit  Medication Sig Dispense Refill  . EPINEPHrine (EPIPEN 2-PAK) 0.3 mg/0.3 mL IJ SOAJ injection Inject 0.3 mLs (0.3 mg total) into the muscle once as needed (for severe allergic reaction). CAll 911 immediately if you have to use this medicine 1 Device 1  . Multiple Vitamin (MULTIVITAMIN WITH MINERALS) TABS tablet Take 1 tablet by mouth daily.    Marland Kitchen ibuprofen (ADVIL,MOTRIN) 800 MG tablet Take 1 tablet (800 mg total) by mouth every 8 (eight) hours as needed. (Patient not taking: Reported on 05/07/2018) 21 tablet 0  . traMADol (ULTRAM) 50 MG tablet Take 1 tablet (50 mg total) by mouth every 6 (six) hours as needed for severe pain. (Patient not taking: Reported on 05/07/2018) 15 tablet 0  . doxycycline (VIBRA-TABS) 100 MG tablet Take 1 tablet (100 mg total) by mouth 2 (two) times daily. (Patient not taking: Reported on 04/17/2018) 20 tablet 0  . HYDROcodone-acetaminophen (NORCO) 5-325 MG tablet Take 1-2 tablets by mouth  every 6 (six) hours as needed for moderate pain or severe pain. (Patient not taking: Reported on 04/17/2018) 30 tablet 0  . hydrOXYzine (ATARAX/VISTARIL) 25 MG tablet Take 1 tablet (25 mg total) by mouth 2 (two) times daily as needed. (Patient not taking: Reported on 04/17/2018) 14 tablet 0   No facility-administered medications prior to visit.      ROS Review of Systems  Constitutional: Negative for chills, fever and malaise/fatigue.  Eyes: Negative for blurred vision.  Respiratory: Negative for shortness of breath.   Cardiovascular: Positive for chest pain. Negative for palpitations.  Gastrointestinal: Negative for abdominal pain and nausea.  Genitourinary: Negative for dysuria and hematuria.  Musculoskeletal: Negative for joint pain and myalgias.  Skin: Negative for rash.  Neurological: Negative for tingling and headaches.  Psychiatric/Behavioral: Negative for depression. The patient is nervous/anxious.     Objective:  BP (!) 139/98 (BP Location: Left Arm, Patient Position: Sitting, Cuff Size: Large)   Pulse 72   Temp 98.2 F (36.8 C) (Oral)   Ht 5\' 3"  (1.6 m)   Wt 269 lb 12.8 oz (122.4 kg)   LMP 04/22/2018 (Exact Date)   SpO2 98%   BMI 47.79 kg/m   BP/Weight 05/07/2018 11/14/9415 4/0/8144  Systolic BP 818 563 -  Diastolic BP 98 85 -  Wt. (Lbs) 269.8 261 267.6  BMI 47.79 47.74 47.4      Physical Exam  Constitutional: She is oriented to person, place, and time.  Well developed,  obese, NAD, polite  HENT:  Head: Normocephalic and atraumatic.  Eyes: No scleral icterus.  Neck: Normal range of motion. Neck supple. Thyromegaly present.  Cardiovascular: Normal rate, regular rhythm and normal heart sounds.  Pulmonary/Chest: Effort normal and breath sounds normal.  Musculoskeletal: She exhibits no edema.  Neurological: She is alert and oriented to person, place, and time.  Skin: Skin is warm and dry. No rash noted. No erythema. No pallor.  Psychiatric: She has a normal mood  and affect. Her behavior is normal. Thought content normal.  Vitals reviewed.    Assessment & Plan:   1. Hospital discharge follow-up - Notes reviewed  2. Chest wall pain - Take OTC Aleve  3. Multiple thyroid nodules - US THYROID; Future - Thyroid Panel With TSH  4. Elevated d-dimer - Sickle cell screen   Follow-up: Return in about 1 month (around 06/04/2018) for Thyroid nodules.   Clent Demark PA

## 2018-05-07 NOTE — Patient Instructions (Signed)
Thyroid Nodule A thyroid nodule is an isolatedgrowth of thyroid cells that forms a lump in your thyroid gland. The thyroid gland is a butterfly-shaped gland. It is found in the lower front of your neck. This gland sends chemical messengers (hormones) through your blood to all parts of your body. These hormones are important in regulating your body temperature and helping your body to use energy. Thyroid nodules are common. Most are not cancerous (are benign). You may have one nodule or several nodules. Different types of thyroid nodules include:  Nodules that grow and fill with fluid (thyroid cysts).  Nodules that produce too much thyroid hormone (hot nodules or hyperthyroid).  Nodules that produce no thyroid hormone (cold nodules or hypothyroid).  Nodules that form from cancer cells (thyroid cancers).  What are the causes? Usually, the cause of this condition is not known. What increases the risk? Factors that make this condition more likely to develop include:  Increasing age. Thyroid nodules become more common in people who are older than 45 years of age.  Gender. ? Benign thyroid nodules are more common in women. ? Cancerous (malignant) thyroid nodules are more common in men.  A family history that includes: ? Thyroid nodules. ? Pheochromocytoma. ? Thyroid carcinoma. ? Hyperparathyroidism.  Certain kinds of thyroid diseases, such as Hashimoto thyroiditis.  Lack of iodine.  A history of head and neck radiation, such as from X-rays.  What are the signs or symptoms? It is common for this condition to cause no symptoms. If you have symptoms, they may include:  A lump in your lower neck.  Feeling a lump or tickle in your throat.  Pain in your neck, jaw, or ear.  Having trouble swallowing.  Hot nodules may cause symptoms that include:  Weight loss.  Warm, flushed skin.  Feeling hot.  Feeling nervous.  A racing heartbeat.  Cold nodules may cause symptoms that  include:  Weight gain.  Dry skin.  Brittle hair. This may also occur with hair loss.  Feeling cold.  Fatigue.  Thyroid cancer nodules may cause symptoms that include:  Hard nodules that feel stuck to the thyroid gland.  Hoarseness.  Lumps in the glands near your thyroid (lymph nodes).  How is this diagnosed? A thyroid nodule may be felt by your health care provider during a physical exam. This condition may also be diagnosed based on your symptoms. You may also have tests, including:  An ultrasound. This may be done to confirm the diagnosis.  A biopsy. This involves taking a sample from the nodule and looking at it under a microscope to see if the nodule is benign.  Blood tests to make sure that your thyroid is working properly.  Imaging tests such as MRI or CT scan may be done if: ? Your nodule is large. ? Your nodule is blocking your airway. ? Cancer is suspected.  How is this treated? Treatment depends on the cause and size of your nodule or nodules. If the nodule is benign, treatment may not be necessary. Your health care provider may monitor the nodule to see if it goes away without treatment. If the nodule continues to grow, is cancerous, or does not go away:  It may need to be drained with a needle.  It may need to be removed with surgery.  If you have surgery, part or all of your thyroid gland may need to be removed as well. Follow these instructions at home:  Pay attention to any changes in your   nodule.  Take over-the-counter and prescription medicines only as told by your health care provider.  Keep all follow-up visits as told by your health care provider. This is important. Contact a health care provider if:  Your voice changes.  You have trouble swallowing.  You have pain in your neck, ear, or jaw that is getting worse.  Your nodule gets bigger.  Your nodule starts to make it harder for you to breathe. Get help right away if:  You have a  sudden fever.  You feel very weak.  Your muscles look like they are shrinking (muscle wasting).  You have mood swings.  You feel very restless.  You feel confused.  You are seeing or hearing things that other people do not see or hear (having hallucinations).  You feel suddenly nauseous or throw up.  You suddenly have diarrhea.  You have chest pain.  There is a loss of consciousness. This information is not intended to replace advice given to you by your health care provider. Make sure you discuss any questions you have with your health care provider. Document Released: 08/12/2004 Document Revised: 05/22/2016 Document Reviewed: 12/31/2014 Elsevier Interactive Patient Education  2018 Elsevier Inc.  

## 2018-05-08 ENCOUNTER — Telehealth (INDEPENDENT_AMBULATORY_CARE_PROVIDER_SITE_OTHER): Payer: Self-pay

## 2018-05-08 LAB — THYROID PANEL WITH TSH
FREE THYROXINE INDEX: 1.2 (ref 1.2–4.9)
T3 Uptake Ratio: 22 % — ABNORMAL LOW (ref 24–39)
T4 TOTAL: 5.5 ug/dL (ref 4.5–12.0)
TSH: 1.67 u[IU]/mL (ref 0.450–4.500)

## 2018-05-08 LAB — SICKLE CELL SCREEN: SICKLE CELL SCREEN: NEGATIVE

## 2018-05-08 NOTE — Telephone Encounter (Signed)
-----   Message from Clent Demark, PA-C sent at 05/08/2018  8:38 AM EDT ----- Thyroid panel normal.

## 2018-05-08 NOTE — Telephone Encounter (Signed)
Patient is aware of normal thyroid panel. Nat Christen, CMA

## 2018-05-09 ENCOUNTER — Telehealth (INDEPENDENT_AMBULATORY_CARE_PROVIDER_SITE_OTHER): Payer: Self-pay

## 2018-05-09 NOTE — Telephone Encounter (Signed)
Patient aware that sickle cell is negative. Nat Christen, CMA

## 2018-05-09 NOTE — Telephone Encounter (Signed)
-----   Message from Clent Demark, PA-C sent at 05/08/2018  6:14 PM EDT ----- Sickle cell negative.

## 2018-05-10 ENCOUNTER — Ambulatory Visit (HOSPITAL_COMMUNITY)
Admission: RE | Admit: 2018-05-10 | Discharge: 2018-05-10 | Disposition: A | Discharge status: Home / Self Care | Payer: 59 | Source: Ambulatory Visit | Attending: Physician Assistant | Admitting: Physician Assistant

## 2018-05-10 ENCOUNTER — Other Ambulatory Visit (INDEPENDENT_AMBULATORY_CARE_PROVIDER_SITE_OTHER): Payer: Self-pay | Admitting: Physician Assistant

## 2018-05-10 DIAGNOSIS — E041 Nontoxic single thyroid nodule: Secondary | ICD-10-CM

## 2018-05-10 DIAGNOSIS — E042 Nontoxic multinodular goiter: Secondary | ICD-10-CM | POA: Insufficient documentation

## 2018-05-10 NOTE — Progress Notes (Unsigned)
FNA ordered.

## 2018-05-11 ENCOUNTER — Telehealth (INDEPENDENT_AMBULATORY_CARE_PROVIDER_SITE_OTHER): Payer: Self-pay

## 2018-05-11 NOTE — Telephone Encounter (Signed)
-----   Message from Clent Demark, PA-C sent at 05/10/2018  5:51 PM EDT ----- One of the three nodules seen on [place] will need further work up with FNA. I have made order for FNA. Please call radiology and make sure they are satisfied with the order. If so, please notify patient that she will need FNA done.

## 2018-05-11 NOTE — Telephone Encounter (Signed)
Patient is aware that one of the three nodules seen on the [place] needs further work with an FNA. Contacted radiology and left message for Toni(who schedules FNA) but she has not called back yet. Will call again. Nat Christen, CMA

## 2018-05-30 ENCOUNTER — Inpatient Hospital Stay: Admission: RE | Admit: 2018-05-30 | Payer: 59 | Source: Ambulatory Visit

## 2018-05-31 ENCOUNTER — Other Ambulatory Visit (INDEPENDENT_AMBULATORY_CARE_PROVIDER_SITE_OTHER): Payer: Self-pay | Admitting: Physician Assistant

## 2018-05-31 DIAGNOSIS — E041 Nontoxic single thyroid nodule: Secondary | ICD-10-CM

## 2018-06-07 ENCOUNTER — Ambulatory Visit
Admission: RE | Admit: 2018-06-07 | Discharge: 2018-06-07 | Disposition: A | Discharge status: Home / Self Care | Payer: 59 | Source: Ambulatory Visit | Attending: Physician Assistant | Admitting: Physician Assistant

## 2018-06-07 ENCOUNTER — Ambulatory Visit (INDEPENDENT_AMBULATORY_CARE_PROVIDER_SITE_OTHER): Payer: 59 | Admitting: Physician Assistant

## 2018-06-07 ENCOUNTER — Other Ambulatory Visit (HOSPITAL_COMMUNITY)
Admission: RE | Admit: 2018-06-07 | Discharge: 2018-06-07 | Disposition: A | Discharge status: Home / Self Care | Payer: 59 | Source: Ambulatory Visit | Attending: Radiology | Admitting: Radiology

## 2018-06-07 DIAGNOSIS — E041 Nontoxic single thyroid nodule: Secondary | ICD-10-CM | POA: Insufficient documentation

## 2018-06-08 ENCOUNTER — Telehealth (INDEPENDENT_AMBULATORY_CARE_PROVIDER_SITE_OTHER): Payer: Self-pay

## 2018-06-08 NOTE — Telephone Encounter (Signed)
-----   Message from Clent Demark, PA-C sent at 06/08/2018  2:04 PM EDT ----- Benign thyroid nodule.

## 2018-06-08 NOTE — Telephone Encounter (Signed)
Called patient but phone rang for several minutes with no voicemail or answering machine to leave message. Will attempt to contact patient once more before mailing results. Nat Christen, CMA

## 2018-06-11 ENCOUNTER — Telehealth (INDEPENDENT_AMBULATORY_CARE_PROVIDER_SITE_OTHER): Payer: Self-pay

## 2018-06-11 NOTE — Telephone Encounter (Signed)
Patient is aware that thyroid nodule is benign. Nat Christen, CMA

## 2018-06-11 NOTE — Telephone Encounter (Signed)
-----   Message from Clent Demark, PA-C sent at 06/08/2018  2:04 PM EDT ----- Benign thyroid nodule.

## 2020-02-01 IMAGING — CR DG CHEST 2V
2 series · 2 of 2 positions shown · non-contrast
Comparison: None.

CLINICAL DATA: 47 y/o  F; mid chest pain and shortness of breath.

EXAM:
CHEST - 2 VIEW

[chest pa]
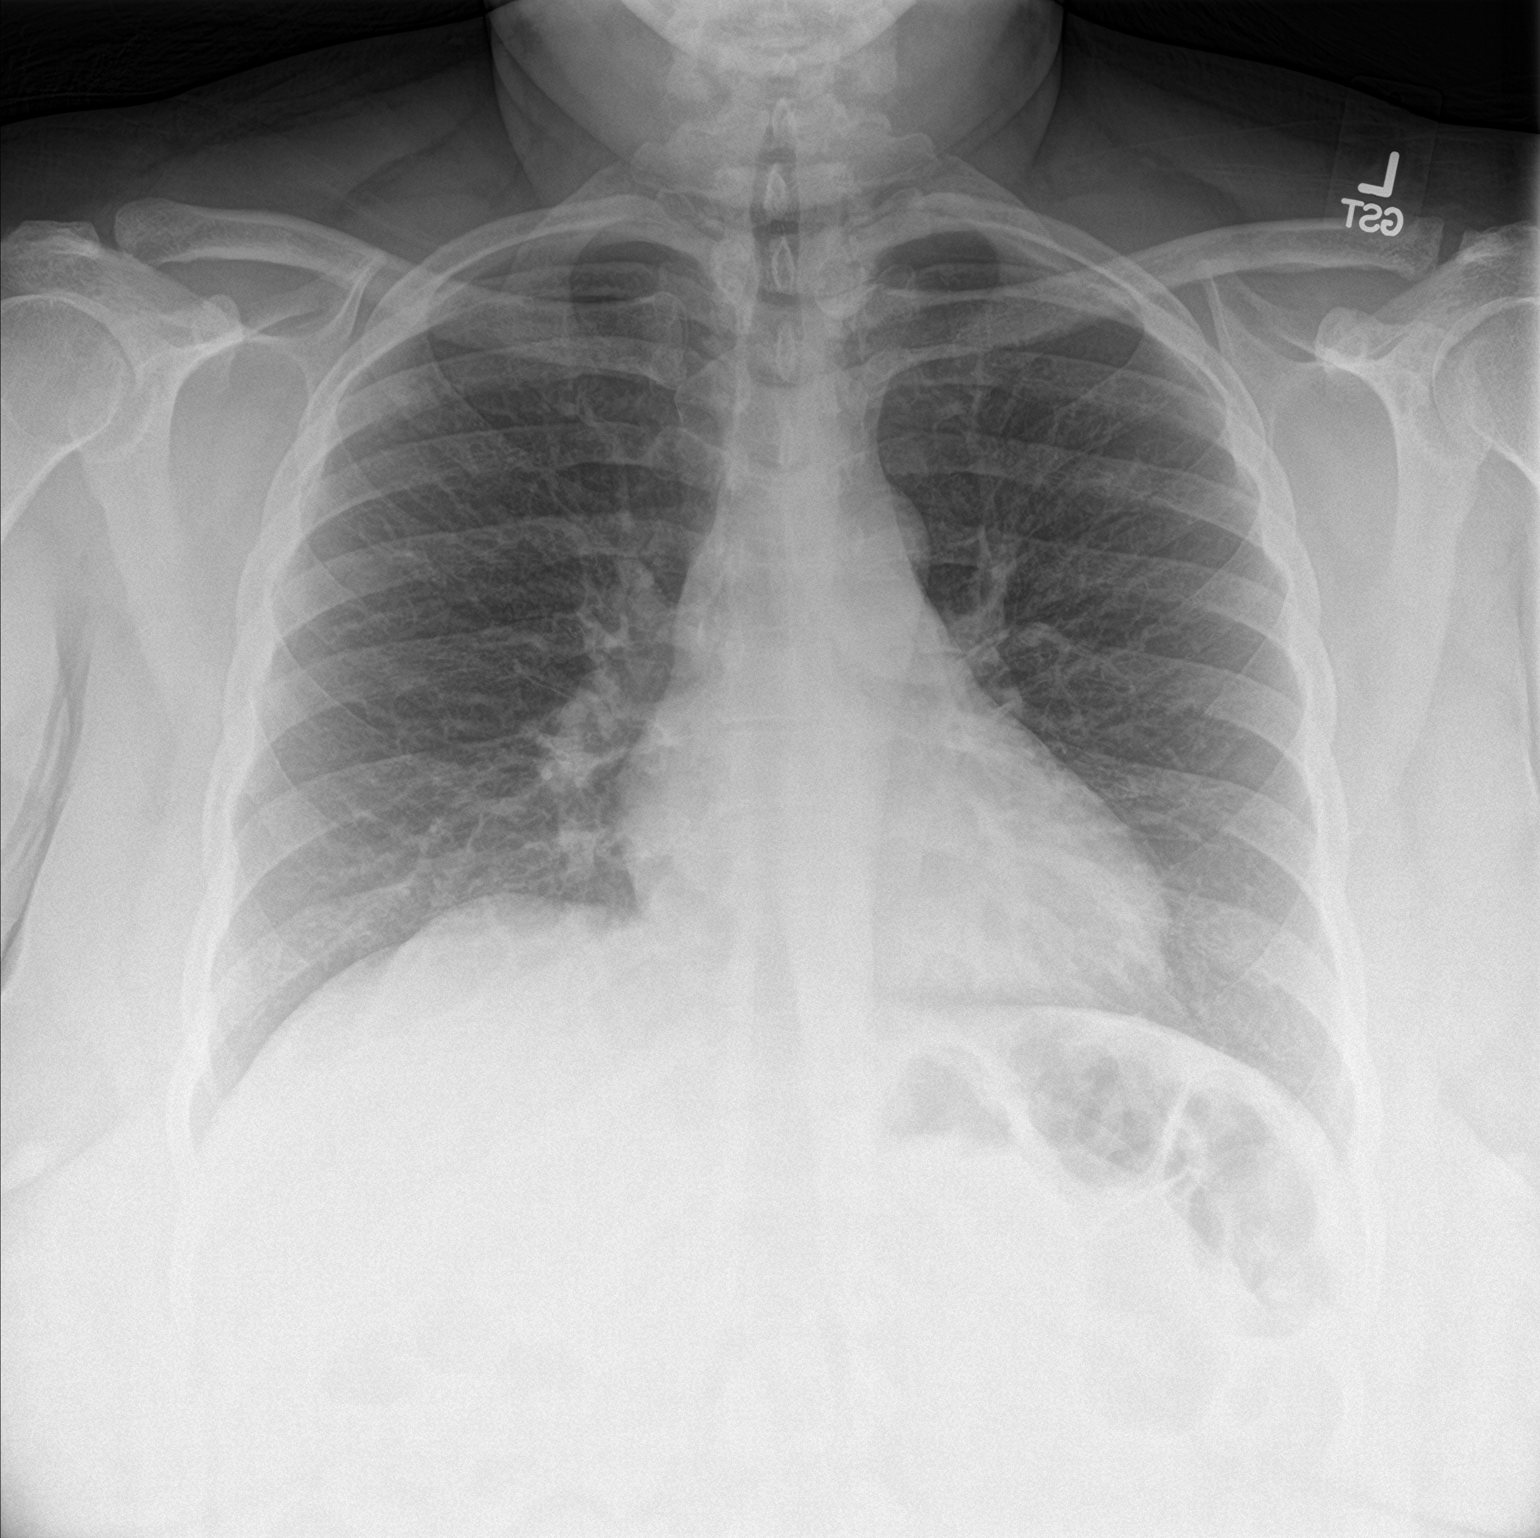

[chest lat]
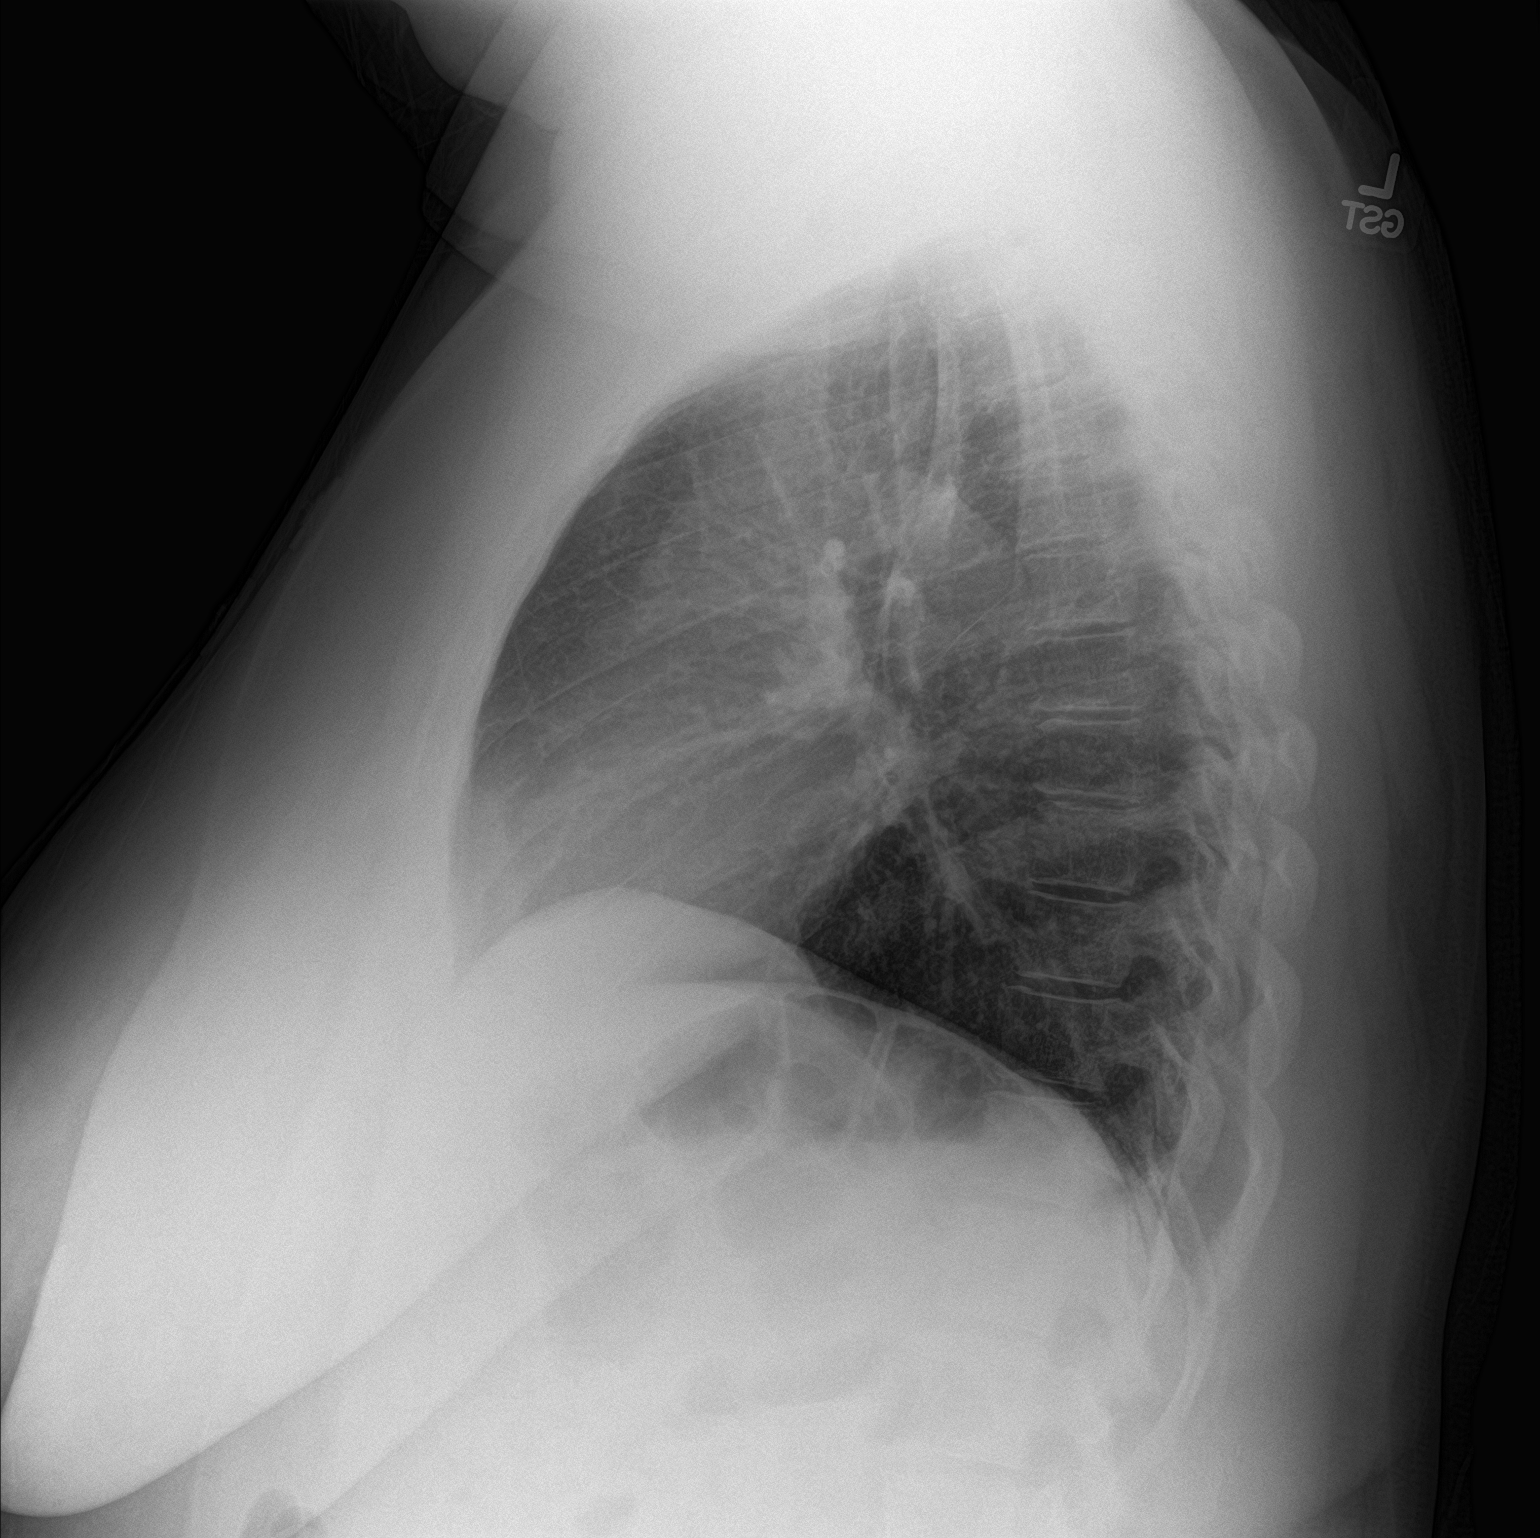

[2 of 2 positions shown; findings below may reference images not displayed]

FINDINGS: The heart size and mediastinal contours are within normal limits.
Both lungs are clear. The visualized skeletal structures are
unremarkable.
IMPRESSION: No acute pulmonary process identified.

By: Charbel Brien M.D.

## 2020-02-01 IMAGING — CT CT ANGIO CHEST
2 of 7 series · 17 of 46 positions shown · IV contrast (APPLIED)
Comparison: No prior CT. Chest x-ray earlier same day is
correlated.

CLINICAL DATA: Two week history of intermittent LEFT-sided chest
pain associated with shortness of breath and diaphoresis. Elevated
D-dimer. Nonsmoker.

EXAM:
CT ANGIOGRAPHY CHEST WITH CONTRAST
TECHNIQUE: Multidetector CT imaging of the chest was performed using the
standard protocol during bolus administration of intravenous
contrast. Multiplanar CT image reconstructions and MIPs were
obtained to evaluate the vascular anatomy.
CONTRAST:  100mL [id number] IOPAMIDOL INJECTION 76% IV.

[Series 7: thins · axial · 0.83mm/px · z∈[+1160,+1366]mm · 14 of 332 slices shown]
[im 19/332  lung]
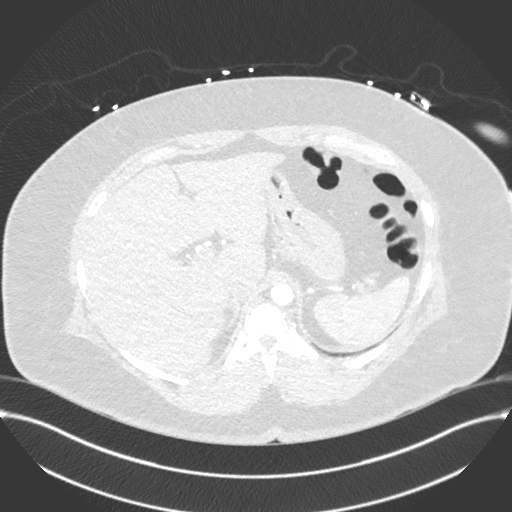
[im 37/332  soft-tissue]
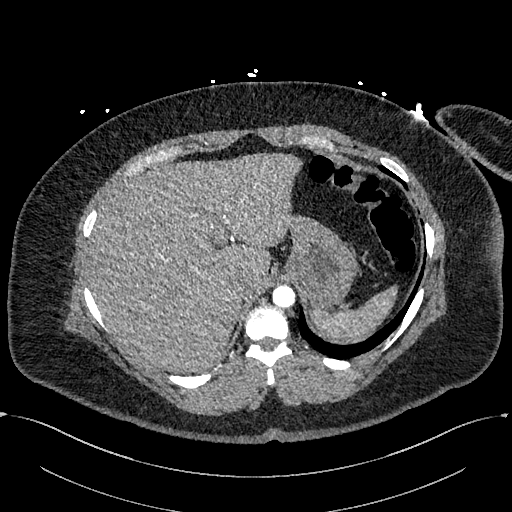
[im 74/332  lung]
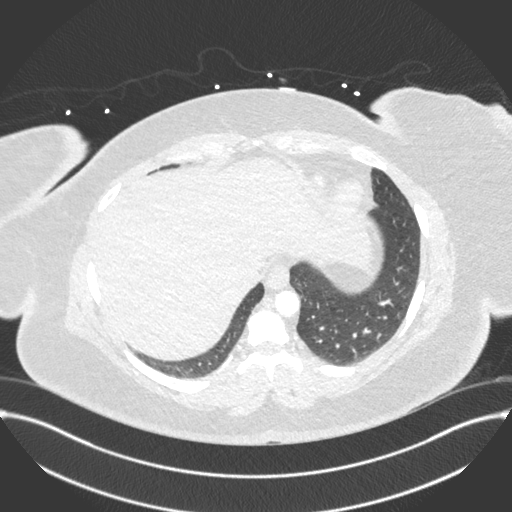
[im 92/332  soft-tissue]
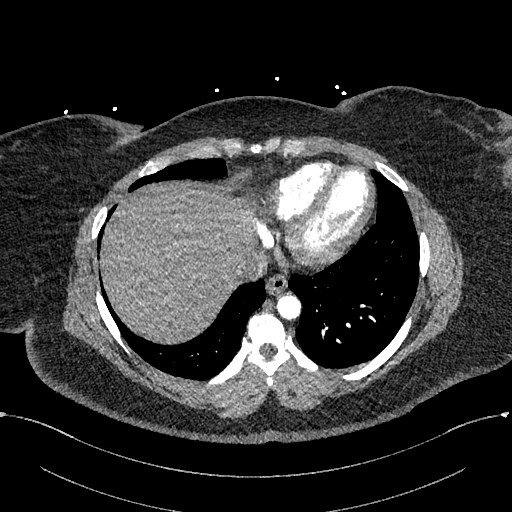
[im 111/332  lung]
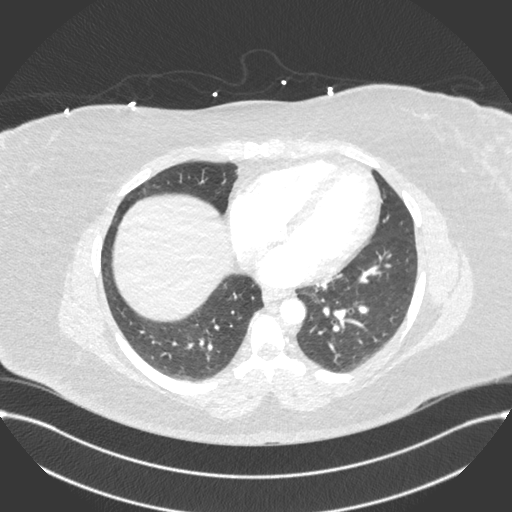
[im 129/332  soft-tissue]
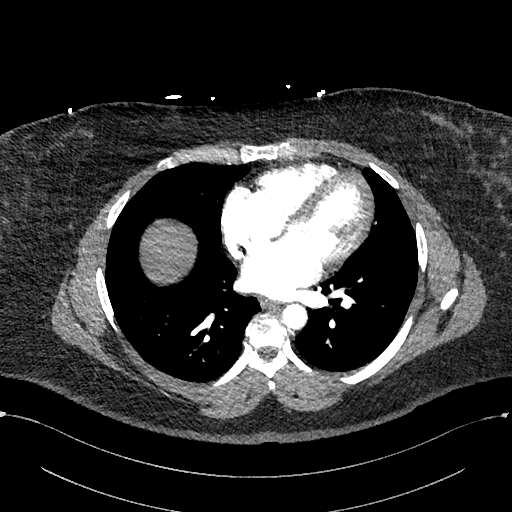
[im 148/332  lung]
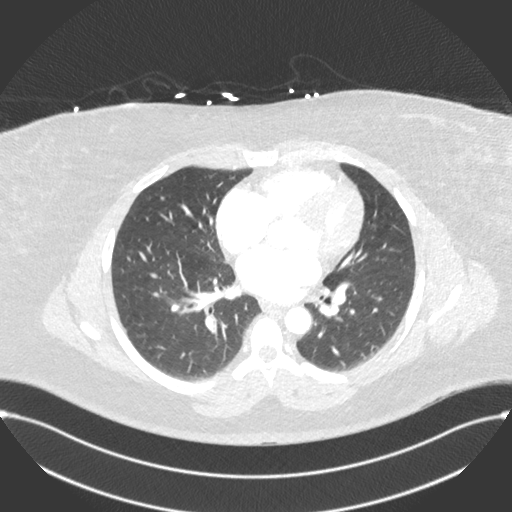
[im 184/332  soft-tissue]
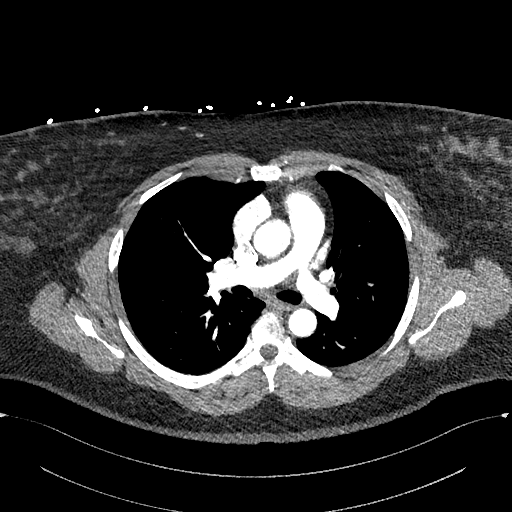
[im 203/332  lung]
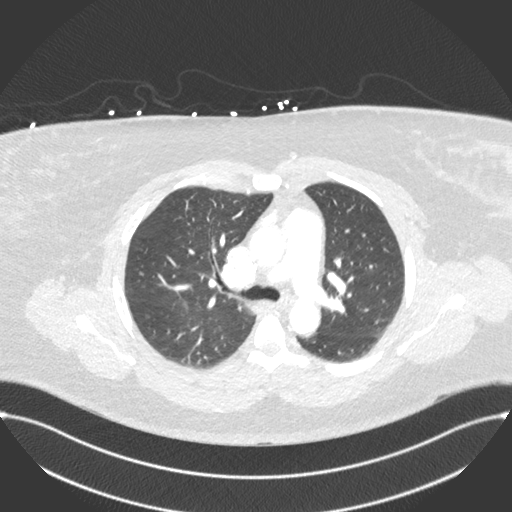
[im 221/332  soft-tissue]
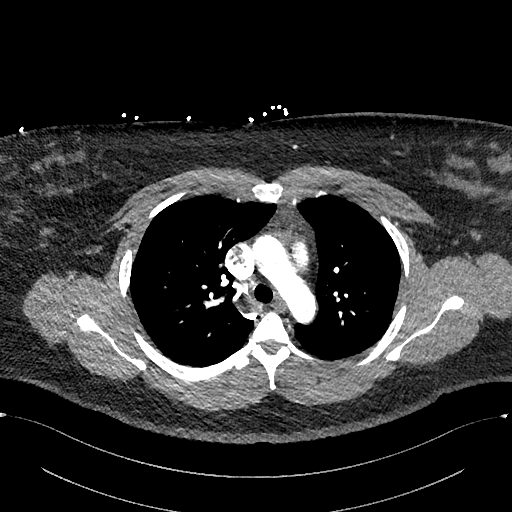
[im 240/332  lung]
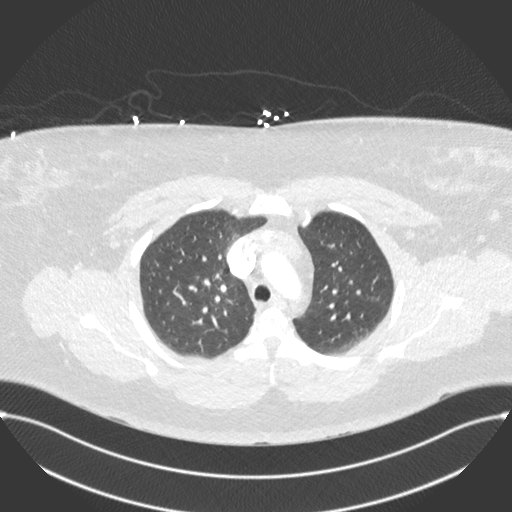
[im 258/332  soft-tissue]
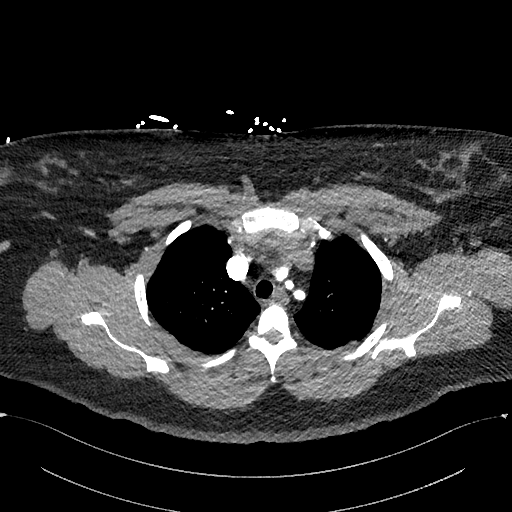
[im 295/332  lung]
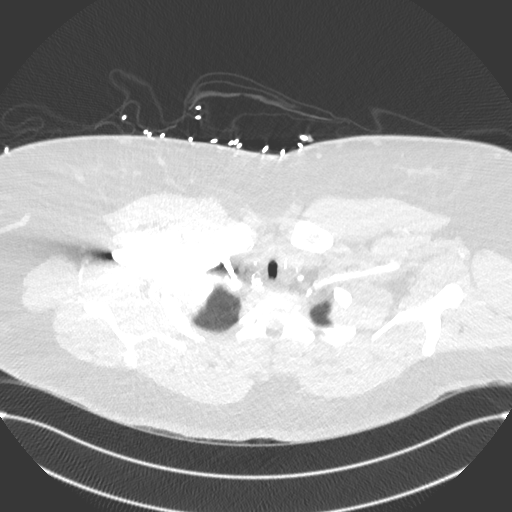
[im 313/332  soft-tissue]
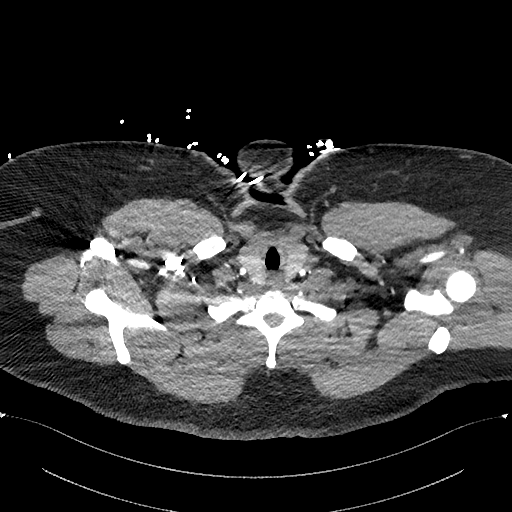

[Series 8: cor · coronal · 0.47mm/px · 3 of 156 slices shown]
[im 39/156  soft-tissue]
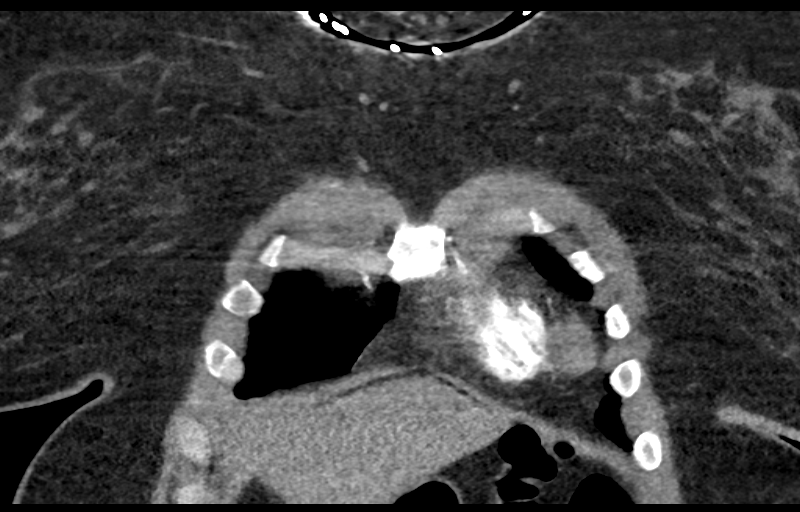
[im 78/156  soft-tissue]
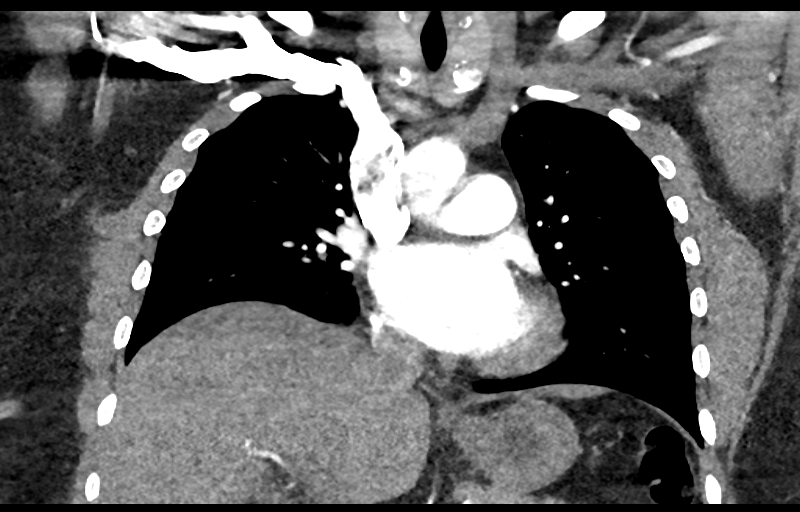
[im 117/156  soft-tissue]
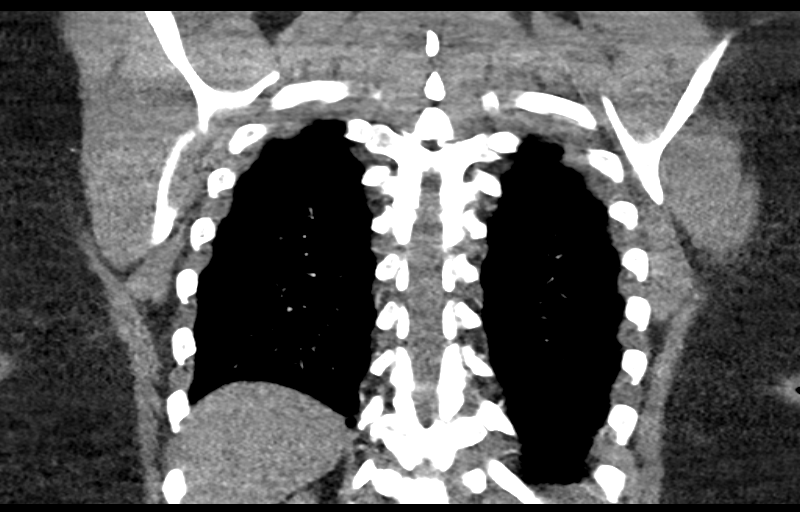

[17 of 46 positions shown; findings below may reference images not displayed]

FINDINGS: Cardiovascular: Contrast opacification of pulmonary arteries is very
good. No filling defects within either main pulmonary artery or
their segmental branches in either lung to suggest pulmonary
embolism.

Heart size normal. No visible coronary atherosclerosis. No
pericardial effusion. No visible atherosclerosis involving the
thoracic or proximal abdominal aorta or their visualized branches.

Mediastinum/Nodes: No pathologically enlarged mediastinal, hilar or
axillary lymph nodes. No mediastinal masses. Normal-appearing
esophagus. Thyroid gland mildly enlarged with substernal extension,
containing densely calcified nodules, the largest measuring
approximately 2 cm involving the lower pole of the LEFT lobe.

Lungs/Pleura: Lung parenchyma clear without evidence of airspace
consolidation, interstitial disease, or parenchymal nodules or
masses. No pleural effusions. Central airways patent with moderate
bronchial wall thickening.

Upper Abdomen: Unremarkable for the early arterial phase of
enhancement.

Musculoskeletal: Minimal to mild degenerative changes involving the
midthoracic spine. No acute findings.

Review of the MIP images confirms the above findings.
IMPRESSION: 1. No evidence of pulmonary embolism.
2. Moderate central bronchial wall thickening indicating bronchitis
and/or asthma. No acute cardiopulmonary disease otherwise.
3. Mild thyromegaly with densely calcified nodules involving the
lower poles of both lobes of the thyroid gland, the largest
measuring approximately 2 cm on the LEFT. Non-emergent thyroid
ultrasound is recommended in further evaluation. This follows ACR
consensus guidelines: Managing Incidental Thyroid Nodules Detected
on Imaging: White Paper of [REDACTED]. [HOSPITAL] 5320; [DATE].

## 2020-03-20 IMAGING — US US THYROID
1 series · 13 of 25 positions shown · non-contrast
Comparison: None.

CLINICAL DATA: Incidental on CT.  Multiple nodules noted on CT

EXAM:
THYROID ULTRASOUND
TECHNIQUE: Ultrasound examination of the thyroid gland and adjacent soft
tissues was performed.

[Series 1: us thyroid · 0.07mm/px · 13 of 54 slices shown]
[im 1/54]
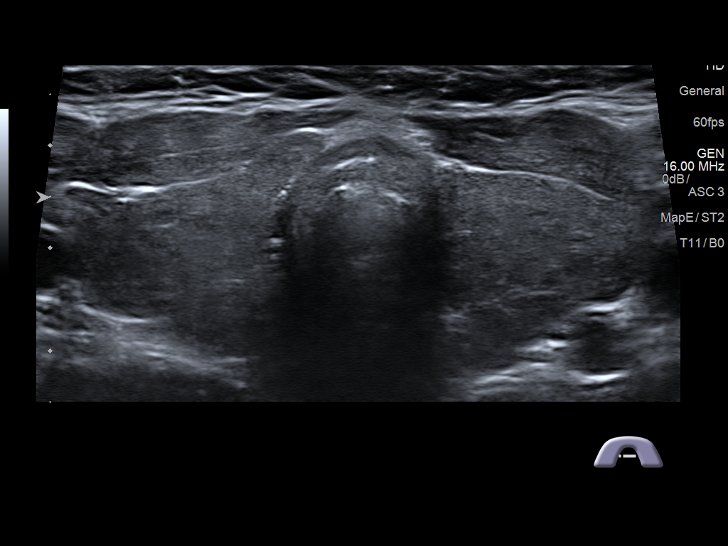
[im 5/54]
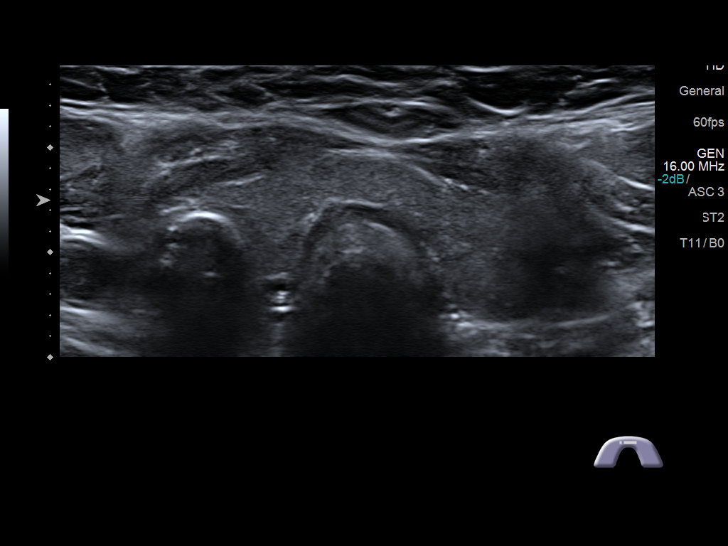
[im 9/54]
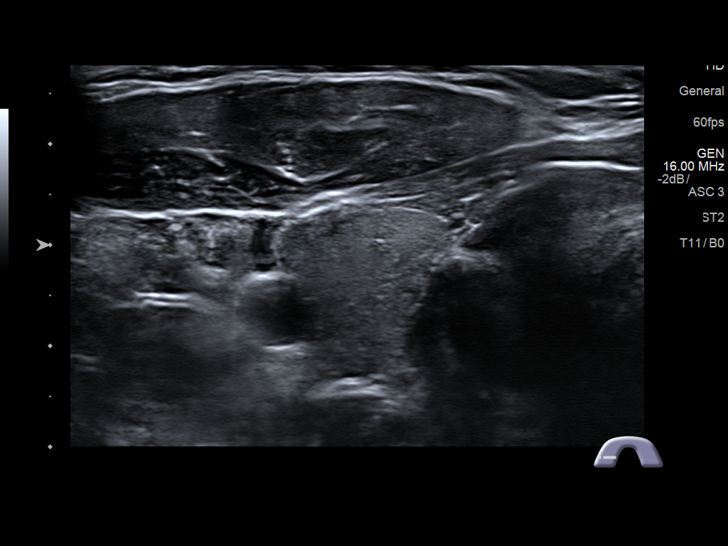
[im 14/54]
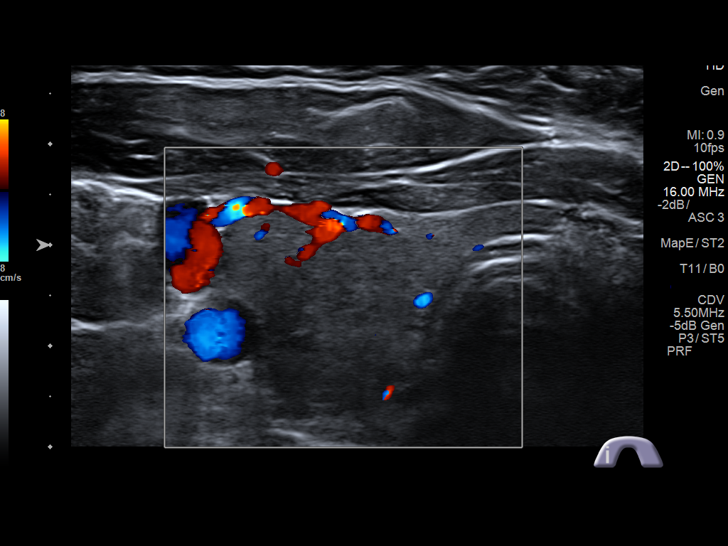
[im 18/54]
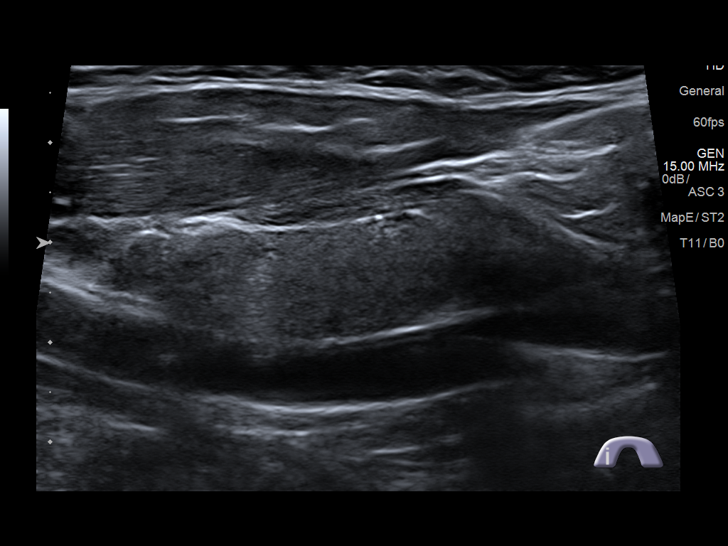
[im 23/54]
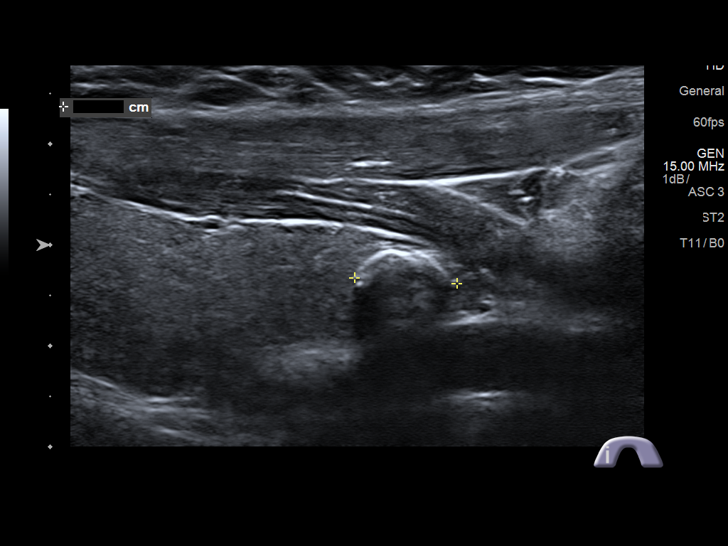
[im 27/54]
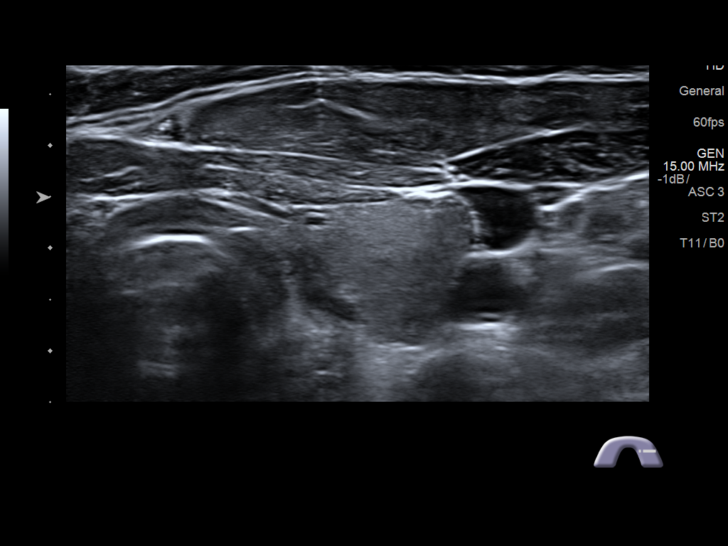
[im 31/54]
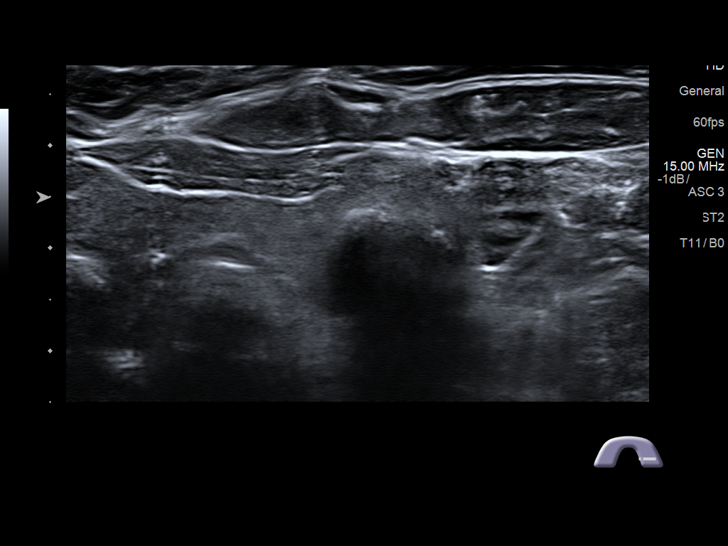
[im 36/54]
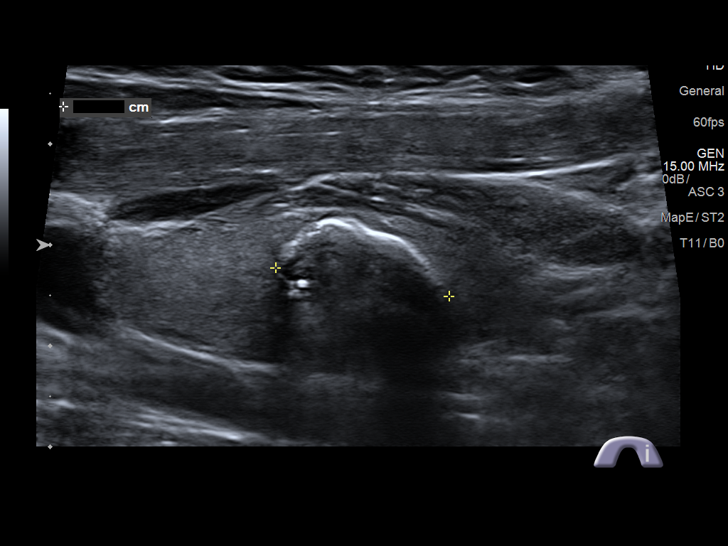
[im 40/54]
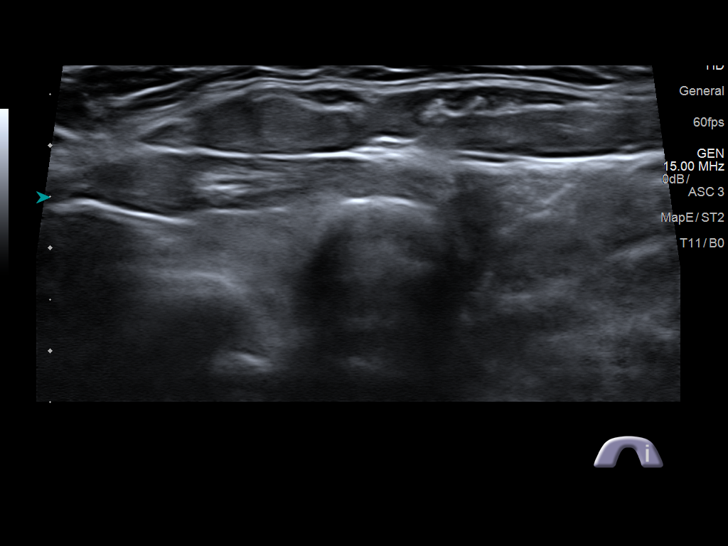
[im 45/54]
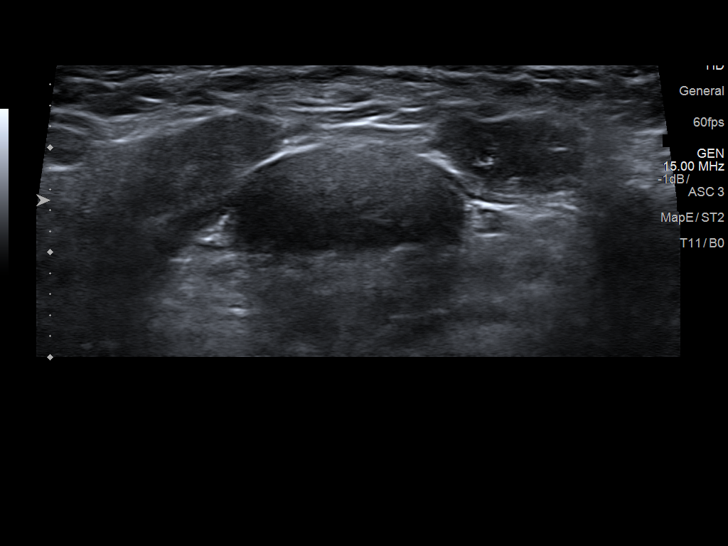
[im 49/54]
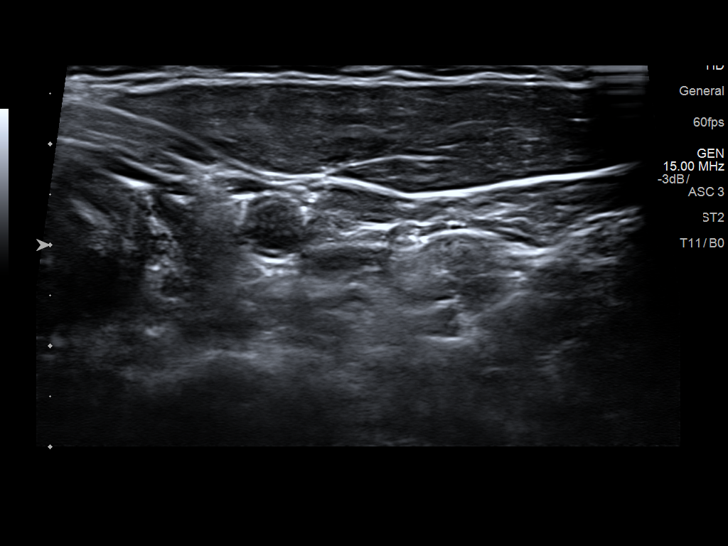
[im 54/54]
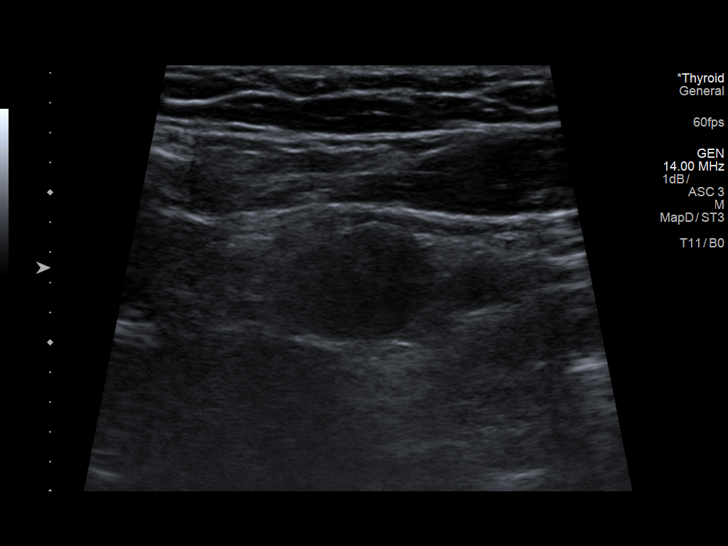

[13 of 25 positions shown; findings below may reference images not displayed]

FINDINGS: Parenchymal Echotexture: Normal

Isthmus: 0.5 cm

Right lobe: 4.5 x 1.8 x 2.1 cm

Left lobe: 5.9 x 1.9 x 2.3 cm

_________________________________________________________

Estimated total number of nodules >/= 1 cm: 3

Number of spongiform nodules >/=  2 cm not described below (TR1): 0

Number of mixed cystic and solid nodules >/= 1.5 cm not described
below (TR2): 0

_________________________________________________________

Nodule # 1:

Location: Right; Mid

Maximum size: 1.4 cm; Other 2 dimensions: 1.1 x 1.0 cm

Composition: cannot determine (2)

Echogenicity: cannot determine (1)

Shape: not taller-than-wide (0)

Margins: smooth (0)

Echogenic foci: peripheral calcifications (2)

ACR TI-RADS total points: 5.

ACR TI-RADS risk category: TR4 (4-6 points).

ACR TI-RADS recommendations:

*Given size (>/= 1 - 1.4 cm) and appearance, a follow-up ultrasound
in 1 year should be considered based on TI-RADS criteria.

_________________________________________________________

Nodule # 2:

Location: Right; Inferior

Maximum size: 1.0 cm; Other 2 dimensions: 0.8 x 0.7 cm

Composition: cannot determine (2)

Echogenicity: cannot determine (1)

Shape: not taller-than-wide (0)

Margins: smooth (0)

Echogenic foci: peripheral calcifications (2)

ACR TI-RADS total points: 5.

ACR TI-RADS risk category: TR4 (4-6 points).

ACR TI-RADS recommendations:

*Given size (>/= 1 - 1.4 cm) and appearance, a follow-up ultrasound
in 1 year should be considered based on TI-RADS criteria.

_________________________________________________________

Nodule # 3:

Location: Left; Mid

Maximum size: 1.7 cm; Other 2 dimensions: 1.6 x 1.2 cm

Composition: cannot determine (2)

Echogenicity: cannot determine (1)

Shape: not taller-than-wide (0)

Margins: smooth (0)

Echogenic foci: punctate echogenic foci (3)

ACR TI-RADS total points: 6.

ACR TI-RADS risk category: TR4 (4-6 points).

ACR TI-RADS recommendations:

**Given size (>/= 1.5 cm) and appearance, fine needle aspiration of
this moderately suspicious nodule should be considered based on
TI-RADS criteria.

_________________________________________________________
IMPRESSION: Nodules 1 and 2 meet criteria for annual follow-up.

Nodule 3 meets criteria for fine needle aspiration biopsy.

The above is in keeping with the ACR TI-RADS recommendations - [HOSPITAL] [id number];[DATE].
